# Patient Record
Sex: Male | Born: 1980 | Race: White | Hispanic: No | Marital: Married | Smoking: Former smoker
Health system: Southern US, Community
[De-identification: ages and names within clinical notes are randomized; demographics above are authoritative.]

## PROBLEM LIST (undated history)

## (undated) ENCOUNTER — Ambulatory Visit (HOSPITAL_COMMUNITY): Admission: EM | Payer: BC Managed Care – PPO | Source: Home / Self Care

## (undated) DIAGNOSIS — R519 Headache, unspecified: Secondary | ICD-10-CM

## (undated) DIAGNOSIS — R51 Headache: Secondary | ICD-10-CM

## (undated) DIAGNOSIS — K589 Irritable bowel syndrome without diarrhea: Secondary | ICD-10-CM

## (undated) DIAGNOSIS — M549 Dorsalgia, unspecified: Secondary | ICD-10-CM

## (undated) HISTORY — PX: SHOULDER SURGERY: SHX246

---
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
## 1998-10-01 ENCOUNTER — Ambulatory Visit (HOSPITAL_BASED_OUTPATIENT_CLINIC_OR_DEPARTMENT_OTHER): Admission: RE | Admit: 1998-10-01 | Discharge: 1998-10-01 | Payer: Self-pay | Admitting: Orthopedic Surgery

## 1999-08-22 ENCOUNTER — Encounter: Payer: Self-pay | Admitting: Emergency Medicine

## 1999-08-22 ENCOUNTER — Emergency Department (HOSPITAL_COMMUNITY): Admission: EM | Admit: 1999-08-22 | Discharge: 1999-08-22 | Payer: Self-pay | Admitting: Emergency Medicine

## 2004-03-30 ENCOUNTER — Emergency Department (HOSPITAL_COMMUNITY): Admission: EM | Admit: 2004-03-30 | Discharge: 2004-03-31 | Payer: Self-pay | Admitting: *Deleted

## 2007-11-27 ENCOUNTER — Emergency Department (HOSPITAL_COMMUNITY): Admission: EM | Admit: 2007-11-27 | Discharge: 2007-11-27 | Payer: Self-pay | Admitting: Emergency Medicine

## 2008-03-13 ENCOUNTER — Emergency Department (HOSPITAL_COMMUNITY): Admission: EM | Admit: 2008-03-13 | Discharge: 2008-03-13 | Payer: Self-pay | Admitting: Emergency Medicine

## 2008-05-26 ENCOUNTER — Emergency Department (HOSPITAL_COMMUNITY): Admission: EM | Admit: 2008-05-26 | Discharge: 2008-05-26 | Payer: Self-pay | Admitting: Emergency Medicine

## 2008-08-26 ENCOUNTER — Emergency Department (HOSPITAL_COMMUNITY): Admission: EM | Admit: 2008-08-26 | Discharge: 2008-08-26 | Payer: Self-pay | Admitting: Emergency Medicine

## 2008-10-30 ENCOUNTER — Emergency Department (HOSPITAL_COMMUNITY): Admission: EM | Admit: 2008-10-30 | Discharge: 2008-10-31 | Payer: Self-pay | Admitting: Emergency Medicine

## 2008-11-11 ENCOUNTER — Emergency Department (HOSPITAL_COMMUNITY): Admission: EM | Admit: 2008-11-11 | Discharge: 2008-11-11 | Payer: Self-pay | Admitting: Emergency Medicine

## 2008-11-14 ENCOUNTER — Encounter: Admission: RE | Admit: 2008-11-14 | Discharge: 2008-11-14 | Payer: Self-pay | Admitting: Neurology

## 2009-04-22 ENCOUNTER — Emergency Department (HOSPITAL_COMMUNITY): Admission: EM | Admit: 2009-04-22 | Discharge: 2009-04-22 | Payer: Self-pay | Admitting: Emergency Medicine

## 2010-08-30 LAB — CBC
HCT: 46.9 % (ref 39.0–52.0)
Hemoglobin: 15.9 g/dL (ref 13.0–17.0)
RBC: 5.13 MIL/uL (ref 4.22–5.81)
RDW: 12.9 % (ref 11.5–15.5)

## 2010-08-30 LAB — DIFFERENTIAL
Basophils Absolute: 0.1 10*3/uL (ref 0.0–0.1)
Eosinophils Relative: 3 % (ref 0–5)
Lymphocytes Relative: 35 % (ref 12–46)
Monocytes Absolute: 0.6 10*3/uL (ref 0.1–1.0)
Monocytes Relative: 6 % (ref 3–12)
Neutro Abs: 5.5 10*3/uL (ref 1.7–7.7)

## 2010-08-30 LAB — POCT I-STAT, CHEM 8
BUN: 16 mg/dL (ref 6–23)
Calcium, Ion: 1.12 mmol/L (ref 1.12–1.32)
Chloride: 105 mEq/L (ref 96–112)
Creatinine, Ser: 0.9 mg/dL (ref 0.4–1.5)
Glucose, Bld: 90 mg/dL (ref 70–99)
TCO2: 25 mmol/L (ref 0–100)

## 2010-09-01 LAB — DIFFERENTIAL
Basophils Relative: 0 % (ref 0–1)
Eosinophils Absolute: 0.2 10*3/uL (ref 0.0–0.7)
Lymphs Abs: 2.5 10*3/uL (ref 0.7–4.0)
Neutro Abs: 4.8 10*3/uL (ref 1.7–7.7)
Neutrophils Relative %: 59 % (ref 43–77)

## 2010-09-01 LAB — COMPREHENSIVE METABOLIC PANEL
ALT: 18 U/L (ref 0–53)
Alkaline Phosphatase: 40 U/L (ref 39–117)
BUN: 12 mg/dL (ref 6–23)
CO2: 28 mEq/L (ref 19–32)
Calcium: 9 mg/dL (ref 8.4–10.5)
GFR calc non Af Amer: >60 mL/min (ref >60)
Glucose, Bld: 98 mg/dL (ref 70–99)
Sodium: 139 mEq/L (ref 135–145)
Total Protein: 6.6 g/dL (ref 6.0–8.3)

## 2010-09-01 LAB — URINALYSIS, ROUTINE W REFLEX MICROSCOPIC
Bilirubin Urine: NEGATIVE
Glucose, UA: NEGATIVE mg/dL
Hgb urine dipstick: NEGATIVE
Ketones, ur: NEGATIVE mg/dL
Leukocytes, UA: NEGATIVE
pH: 5.5 (ref 5.0–8.0)

## 2010-09-01 LAB — CBC
HCT: 43.1 % (ref 39.0–52.0)
Hemoglobin: 14.6 g/dL (ref 13.0–17.0)
MCHC: 34 g/dL (ref 30.0–36.0)
MCV: 91.5 fL (ref 78.0–100.0)
RBC: 4.71 MIL/uL (ref 4.22–5.81)
RDW: 13.4 % (ref 11.5–15.5)

## 2010-09-01 LAB — LIPASE, BLOOD: Lipase: 20 U/L (ref 11–59)

## 2010-11-10 ENCOUNTER — Emergency Department: Payer: Self-pay | Admitting: Emergency Medicine

## 2011-02-01 ENCOUNTER — Emergency Department: Payer: Self-pay | Admitting: Emergency Medicine

## 2011-02-21 LAB — COMPREHENSIVE METABOLIC PANEL
ALT: 16
AST: 21
Albumin: 5
Alkaline Phosphatase: 46
CO2: 22
Chloride: 105
GFR calc Af Amer: >60
Potassium: 4
Total Bilirubin: 1.2

## 2011-02-21 LAB — CBC
Platelets: 204
RBC: 5.58
WBC: 11.9 — ABNORMAL HIGH

## 2011-02-21 LAB — DIFFERENTIAL
Basophils Absolute: 0
Basophils Relative: 0
Eosinophils Absolute: 0
Eosinophils Relative: 0
Monocytes Absolute: 0.3

## 2011-05-24 ENCOUNTER — Emergency Department: Payer: Self-pay | Admitting: Emergency Medicine

## 2011-05-24 LAB — COMPREHENSIVE METABOLIC PANEL
Albumin: 4.7 g/dL (ref 3.4–5.0)
Anion Gap: 12 (ref 7–16)
BUN: 23 mg/dL — ABNORMAL HIGH (ref 7–18)
Bilirubin,Total: 1 mg/dL (ref 0.2–1.0)
Co2: 22 mmol/L (ref 21–32)
Creatinine: 0.88 mg/dL (ref 0.60–1.30)
EGFR (African American): >60
Glucose: 124 mg/dL — ABNORMAL HIGH (ref 65–99)
Potassium: 4.1 mmol/L (ref 3.5–5.1)
SGOT(AST): 25 U/L (ref 15–37)
Sodium: 140 mmol/L (ref 136–145)
Total Protein: 7.8 g/dL (ref 6.4–8.2)

## 2011-05-24 LAB — URINALYSIS, COMPLETE
Blood: NEGATIVE
Glucose,UR: NEGATIVE mg/dL (ref 0–75)
Leukocyte Esterase: NEGATIVE
Nitrite: NEGATIVE
Ph: 5 (ref 4.5–8.0)
RBC,UR: 2 /HPF (ref 0–5)
Specific Gravity: 1.058 (ref 1.003–1.030)
Squamous Epithelial: NONE SEEN
WBC UR: 1 /HPF (ref 0–5)

## 2011-05-24 LAB — LIPASE, BLOOD: Lipase: 146 U/L (ref 73–393)

## 2011-05-24 LAB — RAPID INFLUENZA A&B ANTIGENS

## 2011-05-24 LAB — CBC
HGB: 16.2 g/dL (ref 13.0–18.0)
MCH: 31.2 pg (ref 26.0–34.0)
MCV: 92 fL (ref 80–100)
Platelet: 192 10*3/uL (ref 150–440)
RBC: 5.2 10*6/uL (ref 4.40–5.90)
RDW: 12.8 % (ref 11.5–14.5)

## 2011-06-01 ENCOUNTER — Ambulatory Visit: Payer: Self-pay | Admitting: Surgery

## 2011-06-06 ENCOUNTER — Ambulatory Visit: Payer: Self-pay | Admitting: Surgery

## 2011-12-05 ENCOUNTER — Emergency Department: Payer: Self-pay | Admitting: Emergency Medicine

## 2012-03-06 ENCOUNTER — Emergency Department: Payer: Self-pay | Admitting: Emergency Medicine

## 2012-03-06 LAB — URINALYSIS, COMPLETE
Blood: NEGATIVE
Ketone: NEGATIVE
Ph: 5 (ref 4.5–8.0)
Protein: NEGATIVE
Specific Gravity: 1.03 (ref 1.003–1.030)
Squamous Epithelial: NONE SEEN

## 2012-03-19 ENCOUNTER — Ambulatory Visit: Payer: Self-pay | Admitting: Surgery

## 2012-04-17 ENCOUNTER — Emergency Department: Payer: Self-pay | Admitting: Emergency Medicine

## 2012-04-17 LAB — COMPREHENSIVE METABOLIC PANEL
Albumin: 4.1 g/dL (ref 3.4–5.0)
Alkaline Phosphatase: 45 U/L — ABNORMAL LOW (ref 50–136)
Anion Gap: 6 — ABNORMAL LOW (ref 7–16)
BUN: 12 mg/dL (ref 7–18)
Calcium, Total: 8.9 mg/dL (ref 8.5–10.1)
Co2: 27 mmol/L (ref 21–32)
EGFR (African American): >60
EGFR (Non-African Amer.): >60
Glucose: 95 mg/dL (ref 65–99)
Potassium: 4.1 mmol/L (ref 3.5–5.1)
SGOT(AST): 36 U/L (ref 15–37)
SGPT (ALT): 26 U/L (ref 12–78)

## 2012-04-17 LAB — URINALYSIS, COMPLETE
Bacteria: NONE SEEN
Bilirubin,UR: NEGATIVE
Leukocyte Esterase: NEGATIVE
Ph: 5 (ref 4.5–8.0)
Squamous Epithelial: NONE SEEN

## 2012-04-17 LAB — CBC
MCH: 31.2 pg (ref 26.0–34.0)
MCV: 91 fL (ref 80–100)
Platelet: 178 10*3/uL (ref 150–440)
RBC: 4.51 10*6/uL (ref 4.40–5.90)
RDW: 12.8 % (ref 11.5–14.5)
WBC: 7.3 10*3/uL (ref 3.8–10.6)

## 2012-04-17 LAB — LIPASE, BLOOD: Lipase: 81 U/L (ref 73–393)

## 2012-11-24 ENCOUNTER — Emergency Department: Payer: Self-pay | Admitting: Emergency Medicine

## 2013-03-15 ENCOUNTER — Emergency Department (HOSPITAL_COMMUNITY): Payer: Worker's Compensation

## 2013-03-15 ENCOUNTER — Emergency Department (HOSPITAL_COMMUNITY)
Admission: EM | Admit: 2013-03-15 | Discharge: 2013-03-15 | Disposition: A | Discharge status: Home / Self Care | Payer: Worker's Compensation | Attending: Emergency Medicine | Admitting: Emergency Medicine

## 2013-03-15 DIAGNOSIS — S60219A Contusion of unspecified wrist, initial encounter: Secondary | ICD-10-CM | POA: Insufficient documentation

## 2013-03-15 DIAGNOSIS — Y9289 Other specified places as the place of occurrence of the external cause: Secondary | ICD-10-CM | POA: Insufficient documentation

## 2013-03-15 DIAGNOSIS — IMO0002 Reserved for concepts with insufficient information to code with codable children: Secondary | ICD-10-CM | POA: Insufficient documentation

## 2013-03-15 DIAGNOSIS — S60212A Contusion of left wrist, initial encounter: Secondary | ICD-10-CM

## 2013-03-15 DIAGNOSIS — Y9389 Activity, other specified: Secondary | ICD-10-CM | POA: Insufficient documentation

## 2013-03-15 DIAGNOSIS — Y990 Civilian activity done for income or pay: Secondary | ICD-10-CM | POA: Insufficient documentation

## 2013-03-15 MED ORDER — IBUPROFEN 600 MG PO TABS: 600.0000 mg | ORAL_TABLET | Freq: Once | ORAL | Status: DC

## 2013-03-15 MED ORDER — IBUPROFEN 200 MG PO TABS
600.0000 mg | ORAL_TABLET | Freq: Once | ORAL | Status: AC
  Administered 2013-03-15: 600 mg via ORAL
  Filled 2013-03-15: qty 3

## 2013-03-15 NOTE — Progress Notes (Signed)
Patient confirms his pcp is at [facility].  Patient has the orange card.  EDCM provided patient patient with a list of financial assistance in the community, crisis intervention information, and dental assistance for patients without insurance.  Patient thankful for information.  No further needs at this time.

## 2013-03-15 NOTE — ED Notes (Signed)
Pt states he hit his L forearm on the top of a cooler at work. Pt c/o pain to L wrist pain. Pt states EMS examined his L wrist on scene. Pt arrives with SAM splint to L wrist. ROM intact, but painful.
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
## 2013-03-22 NOTE — ED Provider Notes (Signed)
Medical screening examination/treatment/procedure(s) were performed by non-physician practitioner and as supervising physician I was immediately available for consultation/collaboration.   Leota Jacobsen, MD 03/22/13 [phone]

## 2013-07-31 ENCOUNTER — Encounter (HOSPITAL_COMMUNITY): Payer: Self-pay | Admitting: Emergency Medicine

## 2013-07-31 ENCOUNTER — Emergency Department (HOSPITAL_COMMUNITY)
Admission: EM | Admit: 2013-07-31 | Discharge: 2013-07-31 | Disposition: A | Discharge status: Home / Self Care | Payer: Worker's Compensation | Attending: Emergency Medicine | Admitting: Emergency Medicine

## 2013-07-31 DIAGNOSIS — S0590XA Unspecified injury of unspecified eye and orbit, initial encounter: Secondary | ICD-10-CM

## 2013-07-31 DIAGNOSIS — Y929 Unspecified place or not applicable: Secondary | ICD-10-CM | POA: Insufficient documentation

## 2013-07-31 DIAGNOSIS — Y9389 Activity, other specified: Secondary | ICD-10-CM | POA: Insufficient documentation

## 2013-07-31 DIAGNOSIS — X131XXA Other contact with steam and other hot vapors, initial encounter: Secondary | ICD-10-CM

## 2013-07-31 DIAGNOSIS — Z8719 Personal history of other diseases of the digestive system: Secondary | ICD-10-CM | POA: Insufficient documentation

## 2013-07-31 DIAGNOSIS — X12XXXA Contact with other hot fluids, initial encounter: Secondary | ICD-10-CM | POA: Insufficient documentation

## 2013-07-31 DIAGNOSIS — T2000XA Burn of unspecified degree of head, face, and neck, unspecified site, initial encounter: Secondary | ICD-10-CM

## 2013-07-31 DIAGNOSIS — T2640XA Burn of unspecified eye and adnexa, part unspecified, initial encounter: Secondary | ICD-10-CM | POA: Insufficient documentation

## 2013-07-31 DIAGNOSIS — S058X9A Other injuries of unspecified eye and orbit, initial encounter: Secondary | ICD-10-CM | POA: Insufficient documentation

## 2013-07-31 HISTORY — DX: Irritable bowel syndrome, unspecified: K58.9

## 2013-07-31 MED ORDER — FLUORESCEIN SODIUM 1 MG OP STRP
1.0000 | ORAL_STRIP | Freq: Once | OPHTHALMIC | Status: AC
  Administered 2013-07-31: 1 via OPHTHALMIC
  Filled 2013-07-31: qty 1

## 2013-07-31 MED ORDER — ERYTHROMYCIN 5 MG/GM OP OINT
TOPICAL_OINTMENT | Freq: Once | OPHTHALMIC | Status: AC
  Administered 2013-07-31: 21:00:00 via OPHTHALMIC

## 2013-07-31 MED ORDER — BACITRACIN-NEOMYCIN-POLYMYXIN OINTMENT TUBE
TOPICAL_OINTMENT | Freq: Once | CUTANEOUS | Status: AC
  Administered 2013-07-31: 21:00:00 via TOPICAL
  Filled 2013-07-31: qty 15

## 2013-07-31 MED ORDER — TETRACAINE HCL 0.5 % OP SOLN
1.0000 [drp] | Freq: Once | OPHTHALMIC | Status: AC
  Administered 2013-07-31: 1 [drp] via OPHTHALMIC
  Filled 2013-07-31: qty 2
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
## 2013-07-31 NOTE — ED Provider Notes (Signed)
Medical screening examination/treatment/procedure(s) were performed by non-physician practitioner and as supervising physician I was immediately available for consultation/collaboration.   EKG Interpretation None        Blanchie Dessert, MD 07/31/13 2330
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
## 2014-07-20 ENCOUNTER — Emergency Department (HOSPITAL_COMMUNITY)
Admission: EM | Admit: 2014-07-20 | Discharge: 2014-07-20 | Disposition: A | Discharge status: Home / Self Care | Payer: Medicaid Other | Attending: Emergency Medicine | Admitting: Emergency Medicine

## 2014-07-20 ENCOUNTER — Encounter (HOSPITAL_COMMUNITY): Payer: Self-pay

## 2014-07-20 DIAGNOSIS — K589 Irritable bowel syndrome without diarrhea: Secondary | ICD-10-CM | POA: Insufficient documentation

## 2014-07-20 DIAGNOSIS — R519 Headache, unspecified: Secondary | ICD-10-CM

## 2014-07-20 DIAGNOSIS — R51 Headache: Secondary | ICD-10-CM | POA: Insufficient documentation

## 2014-07-20 HISTORY — DX: Headache, unspecified: R51.9

## 2014-07-20 HISTORY — DX: Headache: R51

## 2014-07-20 MED ORDER — KETOROLAC TROMETHAMINE 30 MG/ML IJ SOLN
30.0000 mg | Freq: Once | INTRAMUSCULAR | Status: AC
  Administered 2014-07-20: 30 mg via INTRAVENOUS
  Filled 2014-07-20: qty 1

## 2014-07-20 MED ORDER — DIPHENHYDRAMINE HCL 50 MG/ML IJ SOLN
50.0000 mg | Freq: Once | INTRAMUSCULAR | Status: AC
  Administered 2014-07-20: 50 mg via INTRAVENOUS
  Filled 2014-07-20: qty 1

## 2014-07-20 MED ORDER — TRAMADOL HCL 50 MG PO TABS: ORAL_TABLET | ORAL | Status: DC

## 2014-07-20 MED ORDER — METOCLOPRAMIDE HCL 5 MG/ML IJ SOLN
10.0000 mg | Freq: Once | INTRAMUSCULAR | Status: AC
  Administered 2014-07-20: 10 mg via INTRAVENOUS
  Filled 2014-07-20: qty 2

## 2014-07-20 NOTE — ED Provider Notes (Signed)
CSN: [id number]     Arrival date & time 07/20/14  1012 History  This chart was scribed for Brian Diego, MD by Stephania Fragmin, ED Scribe. This patient was seen in room APA08/APA08 and the patient's care was started at 10:37 AM.    Chief Complaint  Patient presents with  . Migraine   Patient is a 34 y.o. male presenting with migraines. The history is provided by the patient. No language interpreter was used.  Migraine This is a recurrent problem. The current episode started 2 days ago. The problem occurs constantly. The problem has been gradually worsening. Associated symptoms include headaches. Pertinent negatives include no chest pain and no abdominal pain. Nothing aggravates the symptoms. Nothing relieves the symptoms. He has tried acetaminophen (Excedrin Migraine) for the symptoms. The treatment provided no relief.     HPI Comments: Brian Hess is a 34 y.o. male who presents to the Emergency Department complaining of a severe, constant headache, right greater than left, that started 2 days ago at night and has been worsening since. Per nurse, patient is unable to open his right eye. He has a history of migraines that have been ongoing for several years but states they have never been this severe. Patient usually treats his headaches with Excedrin Migraine, which complete relieve them, but it hasn't worked for this headache.  Past Medical History  Diagnosis Date  . IBS (irritable bowel syndrome)   . Headache    Past Surgical History  Procedure Laterality Date  . Shoulder surgery     No family history on file. History  Substance Use Topics  . Smoking status: Never Smoker   . Smokeless tobacco: Not on file  . Alcohol Use: No    Review of Systems  Constitutional: Negative for appetite change and fatigue.  HENT: Negative for congestion, ear discharge and sinus pressure.   Eyes: Positive for photophobia. Negative for discharge.  Respiratory: Negative for cough.   Cardiovascular:  Negative for chest pain.  Gastrointestinal: Negative for abdominal pain and diarrhea.  Genitourinary: Negative for frequency and hematuria.  Musculoskeletal: Negative for back pain.  Skin: Negative for rash.  Neurological: Positive for headaches. Negative for seizures.  Psychiatric/Behavioral: Negative for hallucinations.      Allergies  Review of patient's allergies indicates no known allergies.  Home Medications   Prior to Admission medications   Medication Sig Start Date End Date Taking? Authorizing Provider  dicyclomine (BENTYL) 10 MG capsule Take 10 mg by mouth daily as needed for spasms.    Historical Provider, MD  ibuprofen (ADVIL,MOTRIN) 600 MG tablet Take 1 tablet (600 mg total) by mouth once. 03/15/13   Garald Balding, NP   BP 119/77 mmHg  Pulse 83  Temp(Src) 98 F (36.7 C) (Oral)  Resp 16  Ht 6' (1.829 m)  Wt 128 lb (58.06 kg)  BMI 17.36 kg/m2  SpO2 97% Physical Exam  Constitutional: He is oriented to person, place, and time. He appears well-developed.  HENT:  Head: Normocephalic.  Eyes: Conjunctivae and EOM are normal. No scleral icterus.  Neck: Neck supple. No thyromegaly present.  Cardiovascular: Normal rate and regular rhythm.  Exam reveals no gallop and no friction rub.   No murmur heard. Pulmonary/Chest: No stridor. He has no wheezes. He has no rales. He exhibits no tenderness.  Abdominal: He exhibits no distension. There is no tenderness. There is no rebound.  Musculoskeletal: Normal range of motion. He exhibits no edema.  Lymphadenopathy:    He has  no cervical adenopathy.  Neurological: He is oriented to person, place, and time. He exhibits normal muscle tone. Coordination normal.  Skin: No rash noted. No erythema.  Psychiatric: He has a normal mood and affect. His behavior is normal.  Nursing note and vitals reviewed.   ED Course  Procedures (including critical care time)  DIAGNOSTIC STUDIES: Oxygen Saturation is 97% on room air, normal by my  interpretation.    COORDINATION OF CARE: 10:40 AM - Discussed treatment plan with pt at bedside which includes medication, and pt agreed to plan.   Medications  ketorolac (TORADOL) 30 MG/ML injection 30 mg (not administered)  metoCLOPramide (REGLAN) injection 10 mg (not administered)  diphenhydrAMINE (BENADRYL) injection 50 mg (not administered)    Labs Review Labs Reviewed - No data to display  Imaging Review No results found.   EKG Interpretation None      MDM   Final diagnoses:  None    Headache improve,   rx ultram  The chart was scribed for me under my direct supervision.  I personally performed the history, physical, and medical decision making and all procedures in the evaluation of this patient.Brian Diego, MD 07/20/14 [phone]

## 2014-07-20 NOTE — Discharge Instructions (Signed)
Follow up as needed with your md °

## 2014-07-20 NOTE — ED Notes (Signed)
Pt states he has had a migraine for two days. States he woke up this morning and was unable to see out of his right eye and his blood pressure was elevated. States he took his Imitrex but it did not help

## 2015-01-09 ENCOUNTER — Encounter (HOSPITAL_COMMUNITY): Payer: Self-pay | Admitting: *Deleted

## 2015-01-09 ENCOUNTER — Emergency Department (HOSPITAL_COMMUNITY): Payer: Medicaid Other

## 2015-01-09 ENCOUNTER — Emergency Department (HOSPITAL_COMMUNITY)
Admission: EM | Admit: 2015-01-09 | Discharge: 2015-01-09 | Disposition: A | Discharge status: Home / Self Care | Payer: Medicaid Other | Attending: Emergency Medicine | Admitting: Emergency Medicine

## 2015-01-09 DIAGNOSIS — X58XXXA Exposure to other specified factors, initial encounter: Secondary | ICD-10-CM | POA: Insufficient documentation

## 2015-01-09 DIAGNOSIS — S66912A Strain of unspecified muscle, fascia and tendon at wrist and hand level, left hand, initial encounter: Secondary | ICD-10-CM | POA: Insufficient documentation

## 2015-01-09 DIAGNOSIS — Y9289 Other specified places as the place of occurrence of the external cause: Secondary | ICD-10-CM | POA: Diagnosis not present

## 2015-01-09 DIAGNOSIS — Y998 Other external cause status: Secondary | ICD-10-CM | POA: Insufficient documentation

## 2015-01-09 DIAGNOSIS — Y9389 Activity, other specified: Secondary | ICD-10-CM | POA: Diagnosis not present

## 2015-01-09 DIAGNOSIS — S6992XA Unspecified injury of left wrist, hand and finger(s), initial encounter: Secondary | ICD-10-CM | POA: Diagnosis present

## 2015-01-09 DIAGNOSIS — Z7982 Long term (current) use of aspirin: Secondary | ICD-10-CM | POA: Diagnosis not present

## 2015-01-09 DIAGNOSIS — Z8719 Personal history of other diseases of the digestive system: Secondary | ICD-10-CM | POA: Insufficient documentation

## 2015-01-09 NOTE — ED Provider Notes (Signed)
CSN: [id number]     Arrival date & time 01/09/15  0941 History   First MD Initiated Contact with Patient 01/09/15 [phone]     Chief Complaint  Patient presents with  . Wrist Pain     HPI   Brian Hess is a 34 y.o. male with a PMH of IBS who presents to the ED with left wrist pain. 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 He reports constant, burning pain to the ulnar aspect of his left wrist. He denies numbness or paresthesia. He states moving his wrist exacerbates his pain, and has tried ice for symptom relief. He reports holding his left wrist still helps to alleviate his pain.     Past Medical History  Diagnosis Date  . IBS (irritable bowel syndrome)   . Headache    Past Surgical History  Procedure Laterality Date  . Shoulder surgery     No family history on file. Social History  Substance Use Topics  . Smoking status: Never Smoker   . Smokeless tobacco: None  . Alcohol Use: No     Review of Systems  Constitutional: Negative for fever and chills.  Respiratory: Negative for shortness of breath.   Cardiovascular: Negative for chest pain.  Gastrointestinal: Negative for nausea, vomiting and abdominal pain.  Musculoskeletal: Positive for arthralgias.  Skin: Negative for color change, pallor, rash and wound.  Neurological: Positive for weakness. Negative for dizziness, light-headedness, numbness and headaches.       Reports his left hand seems weaker, which he attributes to pain.    Allergies  Review of patient's allergies indicates no known allergies.  Home Medications   Prior to Admission medications   Medication Sig Start Date End Date Taking? Authorizing Provider  aspirin-acetaminophen-caffeine (EXCEDRIN MIGRAINE) [phone] MG per tablet Take 1 tablet by mouth  every 6 (six) hours as needed for migraine.    Historical Provider, MD  dicyclomine (BENTYL) 10 MG capsule Take 10 mg by mouth daily as needed for spasms.    Historical Provider, MD  HYDROcodone-acetaminophen (NORCO/VICODIN) 5-325 MG per tablet Take 1 tablet by mouth 3 (three) times daily as needed. 07/08/14   Historical Provider, MD  ibuprofen (ADVIL,MOTRIN) 600 MG tablet Take 1 tablet (600 mg total) by mouth once. Patient not taking: Reported on 07/20/2014 03/15/13   Junius Creamer, NP  traMADol Veatrice Bourbon) 50 MG tablet Take one every 8-12 hours if needed for a headache not helped by your usual medicine 07/20/14   Milton Ferguson, MD    BP 120/75 mmHg  Pulse 84  Temp(Src) 98 F (36.7 C) (Oral)  Resp 14  SpO2 99% Physical Exam  Constitutional: He is oriented to person, place, and time. He appears well-developed and well-nourished. No distress.  HENT:  Head: Normocephalic and atraumatic.  Right Ear: External ear normal.  Left Ear: External ear normal.  Nose: Nose normal.  Mouth/Throat: Oropharynx is clear and moist.  Eyes: Conjunctivae and EOM are normal. Pupils are equal, round, and reactive to light.  Neck: Normal range of motion.  Cardiovascular: Normal rate, regular rhythm and intact distal pulses.   Pulmonary/Chest: Effort normal and breath sounds normal.  Musculoskeletal: He exhibits tenderness. He exhibits no edema.       Left wrist: He exhibits tenderness and  bony tenderness. He exhibits normal range of motion, no swelling, no effusion, no crepitus, no deformity and no laceration.  Tenderness to palpation over ulnar aspect of left wrist. Decreased range of motion of left wrist due to pain.  Neurological: He is alert and oriented to person, place, and time. No sensory deficit.  Decreased grip strength of left hand due to pain.   Skin: Skin is warm and dry. No rash noted. He is not diaphoretic. No erythema. No pallor.  Psychiatric: He has a normal mood and affect. His behavior is normal.  Judgment and thought content normal.  Nursing note and vitals reviewed.   ED Course  Procedures (including critical care time)  Labs Review Labs Reviewed - No data to display  Imaging Review Dg Wrist Complete Left  01/09/2015   CLINICAL DATA:  Left wrist pain and swelling  EXAM: LEFT WRIST - COMPLETE 3+ VIEW  COMPARISON:  03/15/2013  FINDINGS: There is no evidence of fracture or dislocation. There is no evidence of arthropathy or other focal bone abnormality. Soft tissues are unremarkable.  IMPRESSION: Negative.   Electronically Signed   By: Lahoma Crocker M.D.   On: 01/09/2015 11:00    I have personally reviewed and evaluated these images and lab results as part of my medical decision-making.   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 Denies numbness, paresthesia. Ulnar aspect of right wrist tender to palpation on exam. Decreased range of motion of left wrist due to pain. Decreased grip strength of left wrist due to pain. Sensation intact. Distal pulses intact.   Imaging of left wrist obtained given bony tenderness; x-ray negative for fracture. Symptoms likely due to left wrist strain. Advised patient to take ibuprofen, which he states he has at home, and use ice. Patient given wrist splint for symptom relief. Return precautions discussed. Patient to follow up with PCP.  BP 116/78 mmHg  Pulse 76  Temp(Src) 98.2 F (36.8 C) (Oral)  Resp 16  Ht 5' 10"  (1.778 m)  Wt 140 lb (63.504 kg)  BMI 20.09 kg/m2  SpO2 100%     Marella Chimes, PA-C 01/09/15 [place], MD 01/10/15 [phone]

## 2015-01-09 NOTE — Discharge Instructions (Signed)
1. Medications: ibuprofen, usual home medications 2. Treatment: rest, drink plenty of fluids, ice 3. Follow Up: please followup with your primary doctor this week for discussion of your diagnoses and further evaluation after today's visit; if you do not have a primary care doctor use the resource guide provided to find one; please return to the ER for severe pain, fever, chills, weakness, new or worsening symptoms   Emergency Department Resource Guide 1) Find a Doctor and Pay Out of Pocket Although you won't have to find out who is covered by your insurance plan, it is a good idea to ask around and get recommendations. You will then need to call the office and see if the doctor you have chosen will accept you as a new patient and what types of options they offer for patients who are self-pay. Some doctors offer discounts or will set up payment plans for their patients who do not have insurance, but you will need to ask so you aren't surprised when you get to your appointment.  2) Contact Your Local Health Department Not all health departments have doctors that can see patients for sick visits, but many do, so it is worth a call to see if yours does. If you don't know where your local health department is, you can check in your phone book. The CDC also has a tool to help you locate your state's health department, and many state websites also have listings of all of their local health departments.  3) Find a [facility] If your illness is not likely to be very severe or complicated, you may want to try a walk in clinic. These are popping up all over the country in pharmacies, drugstores, and shopping centers. They're usually staffed by nurse practitioners or physician assistants that have been trained to treat common illnesses and complaints. They're usually fairly quick and inexpensive. However, if you have serious medical issues or chronic medical problems, these are probably not your best  option.  No Primary Care Doctor: - Call Health Connect at  [phone] - they can help you locate a primary care doctor that  accepts your insurance, provides certain services, etc. - Physician Referral Service- [phone]  Chronic Pain Problems: Organization         Address  Phone   Notes  [facility]  [phone] Patients need to be referred by their primary care doctor.   Medication Assistance: Organization         Address  Phone   Notes  [facility] Medication [facility] [address] [place], [place] [phone] --Must be a resident of [facility] -- Must have NO insurance coverage whatsoever (no Medicaid/ Medicare, etc.) -- The pt. MUST have a primary care doctor that directs their care regularly and follows them in the community   [facility]ist  [phone]   [employer]  [phone]    Agencies that provide inexpensive medical care: Organization         Address  Phone   Notes  [place]  [phone]   [facility]    [phone]   [facility] [place], [place] [address] [phone]   [place] [phone]   Planned Parenthood    [phone]   [facility]    [phone]   [facility]  [address], [place] Phone:  [phone], Fax:  [phone] Hours of Operation:  9 am - 6 pm, M-F.  Also accepts Medicaid/Medicare and self-pay.  [facility] for [place], [address], [place] Phone: [phone], Fax: [phone]. Hours of Operation:  8:30 am - 5:30 pm, M-F.  Also accepts Medicaid and self-pay.  [facility] [place] [address], [place] Phone: [phone]   [place], [place], [place] [phone], Ext. [phone] Mondays & Thursdays: 7-9 AM.  First 15  patients are seen on a first come, first serve basis.    [place] Providers:  Organization         Address  Phone   Notes  [facility] [address], [place] [phone] Also accepts self-pay patients.  [facility] [address], [place]  [phone]   [place], [address], [place] [phone]   [facility] [address] [place] [phone]   Lucianne Lei [address], [place]   [phone] Only accepts [place] Access [place] patients after they have their name applied to their card.   Self-Pay (no insurance) in [facility]:  Organization         Address  Phone   Notes  Sickle Cell Patients, [facility] [place] [phone]   [facility] [place] [phone]   [facility] [place]  [place], [place], [place] [phone]   [facility]/Dr. Osei-Bonsu  [address], [place] [address], [place] [phone] Phone number for both [place] and [place] locations is the same.  [facility]gent [facility] [address] [place] [phone]   [facility] [address], [place] or [address] [phone]   [facility] [address] [place] [phone], phone; [phone], fax Sees patients 1st and 3rd Saturday of every month.  Must not qualify for public or private insurance (i.e. Medicaid, Medicare, [place] Health Choice, Veterans' Benefits)  Household income should be no more than 200% of the poverty level The clinic cannot treat you if you are pregnant or think you are pregnant  Sexually transmitted diseases are not treated at the clinic.    Dental  Care: Organization         Address  Phone  Notes  [facility] Department of [facility] [place] [phone] Accepts children up to age 61 who are enrolled in [place] or [employer] [place]; pregnant women with a Medicaid card; and children who have applied for Medicaid or [place] Health Choice, but were declined, whose parents can pay a reduced fee at time of service.  [facility] Department of [facility]  [address], [place] [phone] Accepts children up to age 47 who are enrolled in [place] or [place]; pregnant women with a Medicaid card; and children who have applied for Medicaid or [place] Health Choice, but were declined, whose parents can pay a reduced fee at time of service.  [facility] Adult Dental Access PROGRAM  [place], [place] [phone]  Patients are seen by appointment only. Walk-ins are not accepted. [place] will see patients 1 years of age and older. Monday - Tuesday (8am-5pm) Most Wednesdays (8:30-5pm) $30 per visit, cash only  [facility] Adult Dental Access PROGRAM  [address], [facility] [phone] Patients are seen by appointment only. Walk-ins are not accepted. [place] will see patients 28 years of age and older. One Wednesday Evening (Monthly: Volunteer Based).  $30 per visit, cash only  [place]  [phone] for adults; Children under age 81, call Graduate Pediatric Dentistry at [phone]. Children aged 82-14, please call [phone] to request a pediatric application.  Dental services are provided in all areas of dental care including fillings, crowns and bridges, complete and partial dentures, implants, gum treatment, root canals, and extractions. Preventive care is also provided. Treatment is provided to both adults and children. Patients are selected via a lottery and there is often a waiting list.   [facility] [address] [place]  [phone] [email or web address]   [facility] [address] [place], [place] [phone], Ext. [phone] Second and Fourth Thursday of each month, opens at 6:30 AM; Clinic ends at 9 AM.  Patients are seen on a first-come first-served basis, and a limited number are seen during each clinic.   [facility]  [address] Hillard Danker [place], [place] [phone]   Eligibility Requirements You must have lived in [place], [place], or [place] counties for at least the last three months.   You cannot be eligible for state or federal sponsored [employer], including [employer], [place], or [employer].   You generally cannot be eligible for healthcare insurance through your employer.    How to apply: Eligibility screenings are held every Tuesday and Wednesday afternoon from 1:00 pm until 4:00 pm. You do not need an appointment for the interview!  [facility] [address] [place], [place]   [place]  [place] Department  [place]  [phone]    Behavioral Health Resources in the Community: Intensive Outpatient Programs Organization         Address  Phone  Notes  [place]. [address], [place], [place] [phone]   [facility] Outpatient [address] [place], [place]   ADS: Alcohol & Drug Svcs [address], [place], [place]   [place] [address],  [place], [place] or [phone]   Substance Abuse Resources Organization         Address  Phone  Notes  Alcohol and Drug Services  [phone]   [place]  [phone]   The [place]   Chinita Pester  [phone]   Residential & Outpatient Substance Abuse Program  [phone]    Psychological Services Organization         Address  Phone  Notes  [facility] [place]  [place]  [phone]   [place] [address], [place] or [phone]    Mobile Crisis Teams Organization         Address  Phone  Notes  [facility], Mobile Crisis Care Unit  [phone]   [facility]  [address] [employer], [place]   [facility] [address] [place]  [place] [phone]    Self-Help/Support Groups Organization         Address  Phone             Notes  [facility] [place] - variety of support groups  [place] Call for more information  Narcotics Anonymous (NA), [facility]aring [facility] [address], [employer] [place]  2 meetings at this location   Special educational needs teacher         Address  Phone  Notes  ASAP Residential Treatment [place],    [place]  [phone]   [facility]  [address] [place] [address], [place], [place]   [place], [place] [phone] Admissions: 8am-3pm M-F  Incentives Substance [place] [address]    [place], [place] [phone]   [facility] [address] [place], [place], [place]   [facility] [address],  [place], [place]   [facility] Programs - Intensive Outpatient [address] Kristeen [address], [place], [place]   [facility] ([place] [place].,  [place], [place] [phone] or [phone]   [facility] Services (RTS) [address], [place], [place] Accepts Medicaid  [facility] [place] [address],  [place] [phone] Substance Abuse/Addiction Treatment   [facility] Organization         Address  Phone  Notes  [facility]  [phone]   Domenic Schwab, PhD [address] Arlis [place], [place]   [phone] or [phone]   [place], [place] [phone]   [facility] [address], [place], [place] [phone] Insurance/Medicaid/sponsorship through [facility] and Families [address], [address] [place]                                    [place], [place] [phone] [place] [address], [place] [phone]    Dr. Adele Schilder  [phone]   [facility] Dept. 1) [address] [place] 2) [place] 3)  [place] [address], [place] [phone]  [phone]   [place] [phone] or [phone] (After Hours)        Sprain A sprain happens when the bands of tissue that connect bones and hold joints together (ligaments) stretch too much or tear. HOME CARE  Raise (elevate) the injured area to lessen puffiness (swelling).  Put ice on the injured area 2 times a day for 2-3 days.  Put ice in a plastic bag.  Place a towel between your skin and the bag.  Leave the ice on for 15 minutes.  Only take medicine as told by your doctor.  Protect your injured area until your pain and stiffness go away.  Do not get your cast or splint wet. Cover your cast or splint with a plastic bag when you shower or take a bath. Do not swim in a pool.  Your doctor may suggest exercises during your recovery to keep from getting stiff. GET HELP RIGHT AWAY IF:   Your cast or splint becomes damaged.  Your pain gets worse. MAKE SURE  YOU:   Understand these instructions.  Will watch this condition.  Will get help right away if you are not doing well or get worse. Document Released: 10/26/2007 Document Revised: 02/27/2013 Document Reviewed: 05/21/2011 [facility] Patient Information 2015  [place], [place]. This information is not intended to replace advice given to you by your health care provider. Make sure you discuss any questions you have with your health care provider.

## 2015-01-09 NOTE — ED Notes (Signed)
Pt reports Lt wrist pain since Tuesday. Pt reports he works and lifts heavy objects multiple times a day.

## 2015-07-17 ENCOUNTER — Emergency Department (HOSPITAL_COMMUNITY): Payer: Self-pay

## 2015-07-17 ENCOUNTER — Encounter (HOSPITAL_COMMUNITY): Payer: Self-pay | Admitting: Emergency Medicine

## 2015-07-17 ENCOUNTER — Emergency Department (HOSPITAL_COMMUNITY)
Admission: EM | Admit: 2015-07-17 | Discharge: 2015-07-17 | Disposition: A | Discharge status: Home / Self Care | Payer: Self-pay | Attending: Emergency Medicine | Admitting: Emergency Medicine

## 2015-07-17 DIAGNOSIS — N50819 Testicular pain, unspecified: Secondary | ICD-10-CM

## 2015-07-17 DIAGNOSIS — F17200 Nicotine dependence, unspecified, uncomplicated: Secondary | ICD-10-CM | POA: Insufficient documentation

## 2015-07-17 DIAGNOSIS — N50811 Right testicular pain: Secondary | ICD-10-CM | POA: Insufficient documentation

## 2015-07-17 DIAGNOSIS — R110 Nausea: Secondary | ICD-10-CM | POA: Insufficient documentation

## 2015-07-17 HISTORY — DX: Dorsalgia, unspecified: M54.9

## 2015-07-17 LAB — URINALYSIS, ROUTINE W REFLEX MICROSCOPIC
Bilirubin Urine: NEGATIVE
GLUCOSE, UA: NEGATIVE mg/dL
HGB URINE DIPSTICK: NEGATIVE
Ketones, ur: NEGATIVE mg/dL
Leukocytes, UA: NEGATIVE
Nitrite: NEGATIVE
Protein, ur: NEGATIVE mg/dL
SPECIFIC GRAVITY, URINE: 1.023 (ref 1.005–1.030)
pH: 6 (ref 5.0–8.0)

## 2015-07-17 MED ORDER — IBUPROFEN 400 MG PO TABS
800.0000 mg | ORAL_TABLET | Freq: Once | ORAL | Status: AC
  Administered 2015-07-17: 800 mg via ORAL
  Filled 2015-07-17: qty 4

## 2015-07-17 NOTE — Discharge Instructions (Signed)
You have been seen today for testicular pain. Your imaging showed no abnormalities. Follow up with PCP as needed should symptoms continue. Return to ED should symptoms worsen. Use ibuprofen or Tylenol for pain.
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
## 2015-09-17 ENCOUNTER — Encounter (HOSPITAL_COMMUNITY): Payer: Self-pay | Admitting: Emergency Medicine

## 2015-09-17 ENCOUNTER — Emergency Department (HOSPITAL_COMMUNITY)
Admission: EM | Admit: 2015-09-17 | Discharge: 2015-09-17 | Disposition: A | Discharge status: Home / Self Care | Payer: Self-pay | Attending: Emergency Medicine | Admitting: Emergency Medicine

## 2015-09-17 ENCOUNTER — Emergency Department (HOSPITAL_COMMUNITY): Payer: Self-pay

## 2015-09-17 DIAGNOSIS — Y998 Other external cause status: Secondary | ICD-10-CM | POA: Insufficient documentation

## 2015-09-17 DIAGNOSIS — W2209XA Striking against other stationary object, initial encounter: Secondary | ICD-10-CM | POA: Insufficient documentation

## 2015-09-17 DIAGNOSIS — S0991XA Unspecified injury of ear, initial encounter: Secondary | ICD-10-CM | POA: Insufficient documentation

## 2015-09-17 DIAGNOSIS — Z8719 Personal history of other diseases of the digestive system: Secondary | ICD-10-CM | POA: Insufficient documentation

## 2015-09-17 DIAGNOSIS — Y9289 Other specified places as the place of occurrence of the external cause: Secondary | ICD-10-CM | POA: Insufficient documentation

## 2015-09-17 DIAGNOSIS — R197 Diarrhea, unspecified: Secondary | ICD-10-CM | POA: Insufficient documentation

## 2015-09-17 DIAGNOSIS — S060X0A Concussion without loss of consciousness, initial encounter: Secondary | ICD-10-CM | POA: Insufficient documentation

## 2015-09-17 DIAGNOSIS — F17200 Nicotine dependence, unspecified, uncomplicated: Secondary | ICD-10-CM | POA: Insufficient documentation

## 2015-09-17 DIAGNOSIS — Y9389 Activity, other specified: Secondary | ICD-10-CM | POA: Insufficient documentation

## 2015-09-17 LAB — CBC WITH DIFFERENTIAL/PLATELET
BASOS ABS: 0 10*3/uL (ref 0.0–0.1)
Basophils Relative: 0 %
EOS PCT: 3 %
Eosinophils Absolute: 0.1 10*3/uL (ref 0.0–0.7)
HCT: 43.2 % (ref 39.0–52.0)
Hemoglobin: 14.6 g/dL (ref 13.0–17.0)
Lymphocytes Relative: 32 %
Lymphs Abs: 1.5 10*3/uL (ref 0.7–4.0)
MCH: 31 pg (ref 26.0–34.0)
MCHC: 33.8 g/dL (ref 30.0–36.0)
MCV: 91.7 fL (ref 78.0–100.0)
MONO ABS: 0.6 10*3/uL (ref 0.1–1.0)
Monocytes Relative: 12 %
NEUTROS ABS: 2.5 10*3/uL (ref 1.7–7.7)
NEUTROS PCT: 54 %
PLATELETS: 188 10*3/uL (ref 150–400)
RBC: 4.71 MIL/uL (ref 4.22–5.81)
RDW: 13.1 % (ref 11.5–15.5)
WBC: 4.7 10*3/uL (ref 4.0–10.5)

## 2015-09-17 LAB — COMPREHENSIVE METABOLIC PANEL
ALT: 18 U/L (ref 17–63)
AST: 22 U/L (ref 15–41)
Albumin: 3.9 g/dL (ref 3.5–5.0)
Alkaline Phosphatase: 41 U/L (ref 38–126)
Anion gap: 11 (ref 5–15)
BUN: 11 mg/dL (ref 6–20)
CHLORIDE: 106 mmol/L (ref 101–111)
CO2: 26 mmol/L (ref 22–32)
Calcium: 9.1 mg/dL (ref 8.9–10.3)
Creatinine, Ser: 1 mg/dL (ref 0.61–1.24)
Glucose, Bld: 102 mg/dL — ABNORMAL HIGH (ref 65–99)
POTASSIUM: 4.1 mmol/L (ref 3.5–5.1)
Sodium: 143 mmol/L (ref 135–145)
Total Bilirubin: 0.5 mg/dL (ref 0.3–1.2)
Total Protein: 6.2 g/dL — ABNORMAL LOW (ref 6.5–8.1)

## 2015-09-17 LAB — LIPASE, BLOOD: LIPASE: 22 U/L (ref 11–51)

## 2015-09-17 MED ORDER — ONDANSETRON HCL 4 MG/2ML IJ SOLN
4.0000 mg | Freq: Once | INTRAMUSCULAR | Status: AC
  Administered 2015-09-17: 4 mg via INTRAVENOUS
  Filled 2015-09-17: qty 2

## 2015-09-17 MED ORDER — IBUPROFEN 800 MG PO TABS: 800.0000 mg | ORAL_TABLET | Freq: Three times a day (TID) | ORAL | Status: DC

## 2015-09-17 MED ORDER — SODIUM CHLORIDE 0.9 % IV BOLUS (SEPSIS)
1000.0000 mL | Freq: Once | INTRAVENOUS | Status: AC
  Administered 2015-09-17: 1000 mL via INTRAVENOUS

## 2015-09-17 NOTE — ED Provider Notes (Signed)
CSN: [id number]     Arrival date & time 09/17/15  0932 History   First MD Initiated Contact with Patient 09/17/15 [phone]     Chief Complaint  Patient presents with  . Otalgia  . Headache  . Emesis     (Consider location/radiation/quality/duration/timing/severity/associated sxs/prior Treatment) Patient is a 35 y.o. male presenting with ear pain, headaches, and vomiting.  Otalgia Location:  Right Quality:  Aching and pressure Severity:  No pain Onset quality:  Gradual Duration:  4 days Timing:  Constant Chronicity:  New Context: direct blow   Relieved by:  None tried Worsened by:  Nothing tried Ineffective treatments:  None tried Associated symptoms: diarrhea, headaches and vomiting   Associated symptoms: no fever   Headache Associated symptoms: diarrhea, ear pain, nausea and vomiting   Associated symptoms: no fever   Emesis Associated symptoms: chills, diarrhea and headaches     Past Medical History  Diagnosis Date  . IBS (irritable bowel syndrome)   . Headache   . Back pain    Past Surgical History  Procedure Laterality Date  . Shoulder surgery     No family history on file. Social History  Substance Use Topics  . Smoking status: Current Some Day Smoker  . Smokeless tobacco: None  . Alcohol Use: No    Review of Systems  Constitutional: Positive for chills. Negative for fever.  HENT: Positive for ear pain.   Gastrointestinal: Positive for nausea, vomiting and diarrhea.  Skin: Negative for pallor and wound.  Neurological: Positive for headaches.  All other systems reviewed and are negative.     Allergies  Bee venom and Iodine  Home Medications   Prior to Admission medications   Medication Sig Start Date End Date Taking? Authorizing Provider  aspirin-acetaminophen-caffeine (EXCEDRIN MIGRAINE) [phone] MG per tablet Take 1 tablet by mouth every 6 (six) hours as needed for migraine.   Yes Historical Provider, MD  dicyclomine (BENTYL) 10 MG capsule  Take 10 mg by mouth daily as needed for spasms.   Yes Historical Provider, MD  traMADol (ULTRAM) 50 MG tablet Take one every 8-12 hours if needed for a headache not helped by your usual medicine 07/20/14  Yes Milton Ferguson, MD  HYDROcodone-acetaminophen (NORCO/VICODIN) 5-325 MG per tablet Take 1 tablet by mouth 3 (three) times daily as needed. 07/08/14   Historical Provider, MD  ibuprofen (ADVIL,MOTRIN) 800 MG tablet Take 1 tablet (800 mg total) by mouth 3 (three) times daily. 09/17/15   Merrily Pew, MD   BP 106/73 mmHg  Pulse 64  Temp(Src) 98.2 F (36.8 C) (Oral)  Resp 14  Ht 5' 10"  (1.778 m)  Wt 140 lb (63.504 kg)  BMI 20.09 kg/m2  SpO2 99% Physical Exam  Constitutional: He is oriented to person, place, and time. He appears well-developed and well-nourished.  HENT:  Head: Normocephalic and atraumatic.  Neck: Normal range of motion.  Cardiovascular: Normal rate.   Pulmonary/Chest: Effort normal and breath sounds normal. No respiratory distress.  Abdominal: Soft. Bowel sounds are normal. He exhibits no distension. There is no tenderness.  Musculoskeletal: Normal range of motion. He exhibits no edema or tenderness.  Neurological: He is alert and oriented to person, place, and time.  No altered mental status, able to give full seemingly accurate history.  Face is symmetric, EOM's intact, pupils equal and reactive, vision intact, tongue and uvula midline without deviation Upper and Lower extremity motor 5/5, intact pain perception in distal extremities, 2+ reflexes in biceps, patella and achilles tendons. Finger  to nose normal, heel to shin normal.  Skin: Skin is warm and dry. No rash noted.  Nursing note and vitals reviewed.   ED Course  Procedures (including critical care time) Labs Review Labs Reviewed  COMPREHENSIVE METABOLIC PANEL - Abnormal; Notable for the following:    Glucose, Bld 102 (*)    Total Protein 6.2 (*)    All other components within normal limits  CBC WITH  DIFFERENTIAL/PLATELET  LIPASE, BLOOD    Imaging Review Ct Head Wo Contrast  09/17/2015  CLINICAL DATA:  Hit in the head on Saturday, headache and dizziness since, initial encounter. EXAM: CT HEAD WITHOUT CONTRAST TECHNIQUE: Contiguous axial images were obtained from the base of the skull through the vertex without intravenous contrast. COMPARISON:  11/11/2008. FINDINGS: Brain: No evidence of an acute infarct, acute hemorrhage, mass lesion, mass effect or hydrocephalus. Vascular: No hyperdense vessel or unexpected calcification. Skull: Negative for fracture or focal lesion. Sinuses/Orbits: No acute findings. Other: None. IMPRESSION: Negative. Electronically Signed   By: Lorin Picket M.D.   On: 09/17/2015 12:49   I have personally reviewed and evaluated these images and lab results as part of my medical decision-making.   EKG Interpretation None      MDM   Final diagnoses:  Concussion, without loss of consciousness, initial encounter    Likely post concussive syndrome. No e/o fracture or head bleed on CT. Will give return precautions.  Also with likely gastroenteritis that seems to be improving. Had some coffee ground appearing emesis but no obvious bright red blood, normal hemoglobin here, doubt boerhaves or other significant cause of blood, possibly mallory weiss but is improving so no need for admission, will follow up with PCP if symptoms come back.   New Prescriptions: Discharge Medication List as of 09/17/2015  1:00 PM      I have personally and contemperaneously reviewed labs and imaging and used in my decision making as above.   A medical screening exam was performed and I feel the patient has had an appropriate workup for their chief complaint at this time and likelihood of emergent condition existing is low and thus workup can continue on an outpatient basis.. Their vital signs are stable. They have been counseled on decision, discharge, follow up and which symptoms  necessitate immediate return to the emergency department.  They verbally stated understanding and agreement with plan and discharged in stable condition.      Merrily Pew, MD 09/17/15 [phone]

## 2015-09-17 NOTE — ED Notes (Signed)
Pt c/o right ear pain, and headache-- was in a bounce house on Saturday at a birthday party and was hit on right side of head/ear area with an inflatable boxing glove-- also states has been throwing up coffee ground looking emesis--family has been vomiting and having diarrhea.

## 2015-11-03 ENCOUNTER — Ambulatory Visit: Payer: Self-pay | Admitting: Gastroenterology

## 2015-11-03 ENCOUNTER — Telehealth: Payer: Self-pay | Admitting: *Deleted

## 2015-11-03 NOTE — Telephone Encounter (Signed)
Mailed out missed appointment letter today, 11-03-2015.  Provider: Alonza Bogus PA-C.

## 2017-05-23 HISTORY — PX: COLONOSCOPY: SHX174

## 2018-06-28 ENCOUNTER — Encounter: Payer: Self-pay | Admitting: Gastroenterology

## 2018-07-20 ENCOUNTER — Encounter: Payer: Self-pay | Admitting: Gastroenterology

## 2018-07-20 ENCOUNTER — Ambulatory Visit: Payer: Self-pay | Admitting: Gastroenterology

## 2018-07-20 VITALS — BP 90/60 | HR 88 | Ht 70.0 in | Wt 142.0 lb

## 2018-07-20 DIAGNOSIS — K625 Hemorrhage of anus and rectum: Secondary | ICD-10-CM

## 2018-07-20 DIAGNOSIS — R195 Other fecal abnormalities: Secondary | ICD-10-CM

## 2018-07-20 NOTE — Progress Notes (Signed)
Referring Provider: Lavonne Chick, MD Primary Care Physician:  Laneta Simmers, NP   Reason for Consultation:  Rectal bleeding   IMPRESSION:  Rectal bleeding, initially started in 2017 Recent change in stool caliber - ribbon-like for 2-3 months NSAID use for back pain And family history of colon cancer or polyps  The differential for rectal bleeding is broad.  It includes outlet sources such as fissure or hemorrhoids, as well as polyps, mass, ulcers, and colitis.  He has no extra GI manifestations of inflammatory bowel disease.  Given this differential I am recommending a colonoscopy.  PLAN: Colonoscopy  I consented the patient at the bedside today discussing the risks, benefits, and alternatives to endoscopic evaluation. In particular, we discussed the risks that include, but are not limited to, reaction to medication, cardiopulmonary compromise, bleeding requiring blood transfusion, aspiration resulting in pneumonia, perforation requiring surgery, lack of diagnosis, severe illness requiring hospitalization, and even death. We reviewed the risk of missed lesion including polyps or even cancer. The patient acknowledges these risks and asks that we proceed.  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 Tobin Chad for further evaluation of rectal bleeding.  The history is obtained through the patient and review of his referral records.  He has a history of back pain with radiculopathy, satiety, migraines, and tobacco habituation.  Initially mentioned rectal bleeding to his PCP in 2017. Occurring more frequently over the last couple of months. Initially red on the toilet paper. Now blood in the sides of the toilet.    One formed BM daily to every 3 days.  Over last couple of months they have become more ribbon like.  Prior to that they were formed.  No constipation or diarrhea.  Abdominal pain.  No mucus.  No rectal pain or tenesmus.  He has some loss of  appetite.  His weight has fluctuated.  No other associated symptoms. No identified exacerbating or relieving features.   Hemoglobin 14.1 on June 27, 2018.  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   No known family history of colon cancer or polyps.  He is concerned about the possibility of cancer.  Past Medical History:  Diagnosis Date  . Back pain   . Headache   . IBS (irritable bowel syndrome)     Past Surgical History:  Procedure Laterality Date  . SHOULDER SURGERY      Current Outpatient Medications  Medication Sig Dispense Refill  . HYDROcodone-acetaminophen (NORCO/VICODIN) 5-325 MG per tablet Take 1 tablet by mouth 3 (three) times daily as needed.  0  . sertraline (ZOLOFT) 100 MG tablet Take 100 mg by mouth daily.    Marland Kitchen aspirin-acetaminophen-caffeine (EXCEDRIN MIGRAINE) 250-250-65 MG per tablet Take 1 tablet by mouth every 6 (six) hours as needed for migraine.    . dicyclomine (BENTYL) 10 MG capsule Take 10 mg by mouth daily as needed for spasms.    Marland Kitchen ibuprofen (ADVIL,MOTRIN) 800 MG tablet Take 1 tablet (800 mg total) by mouth 3 (three) times daily. (Patient not taking: Reported on 07/20/2018) 21 tablet 0  . traMADol (ULTRAM) 50 MG tablet Take one every 8-12 hours if needed for a headache not helped by your usual medicine (Patient not taking: Reported on 07/20/2018) 10 tablet 0   No current facility-administered medications for this visit.     Allergies as of 07/20/2018 - Review Complete 07/20/2018  Allergen Reaction Noted  . Bee venom Anaphylaxis 01/09/2015  .  Iodine Anaphylaxis 01/09/2015    Family History  Problem Relation Age of Onset  . Diabetes Mother   . Cancer Maternal Grandfather        not sure what king  . Diabetes Maternal Grandfather     Social History   Socioeconomic History  . Marital status: Single    Spouse name: Not on file  . Number of children: Not on file  . Years of education: Not on file  . Highest education level: Not on  file  Occupational History  . Not on file  Social Needs  . Financial resource strain: Not on file  . Food insecurity:    Worry: Not on file    Inability: Not on file  . Transportation needs:    Medical: Not on file    Non-medical: Not on file  Tobacco Use  . Smoking status: Current Some Day Smoker  . Smokeless tobacco: Never Used  Substance and Sexual Activity  . Alcohol use: No  . Drug use: Yes    Types: Marijuana  . Sexual activity: Not on file  Lifestyle  . Physical activity:    Days per week: Not on file    Minutes per session: Not on file  . Stress: Not on file  Relationships  . Social connections:    Talks on phone: Not on file    Gets together: Not on file    Attends religious service: Not on file    Active member of club or organization: Not on file    Attends meetings of clubs or organizations: Not on file    Relationship status: Not on file  . Intimate partner violence:    Fear of current or ex partner: Not on file    Emotionally abused: Not on file    Physically abused: Not on file    Forced sexual activity: Not on file  Other Topics Concern  . Not on file  Social History Narrative  . Not on file    Review of Systems: 12 system ROS is negative except as noted above additions of allergies, back pain, fatigue, depression, headaches, insomnia.  Filed Weights   07/20/18 1500  Weight: 142 lb (64.4 kg)    Physical Exam: Vital signs were reviewed. General:   Alert, well-nourished, pleasant and cooperative in NAD Head:  Normocephalic and atraumatic. Eyes:  Sclera clear, no icterus.   Conjunctiva pink. Mouth:  No deformity or lesions.  Edentulous.  Neck:  Supple; no thyromegaly. Lungs:  Clear throughout to auscultation.   No wheezes.  Heart:  Regular rate and rhythm; no murmurs Abdomen:  Soft, nontender, normal bowel sounds. No rebound or guarding. No hepatosplenomegaly Rectal:  Deferred  Msk:  Symmetrical without gross deformities. Extremities:  No  gross deformities or edema. Neurologic:  Alert and  oriented x4;  grossly nonfocal Skin:  No rash or bruise. Tattoos.  Psych:  Alert and cooperative. Normal mood and affect.   Adilenne Ashworth L. Tarri Glenn, MD, MPH [facility] 07/27/2018, 8:39 AM

## 2018-07-20 NOTE — Patient Instructions (Addendum)
If you are age 40 or older, your body mass index should be between 23-30. Your Body mass index is 20.37 kg/m. If this is out of the aforementioned range listed, please consider follow up with your Primary Care Provider.  If you are age 62 or younger, your body mass index should be between 19-25. Your Body mass index is 20.37 kg/m. If this is out of the aformentioned range listed, please consider follow up with your Primary Care Provider.   You have been scheduled for a colonoscopy. Please follow written instructions given to you at your visit today.  Please pick up your prep supplies at the pharmacy within the next 1-3 days. If you use inhalers (even only as needed), please bring them with you on the day of your procedure. Your physician has requested that you go to www.startemmi.com and enter the access code given to you at your visit today. This web site gives a general overview about your procedure. However, you should still follow specific instructions given to you by our office regarding your preparation for the procedure.  We have given you samples of the following medication to take: Suprep  Thank you for choosing me and [facility].  Dr. Thornton Park

## 2018-07-27 ENCOUNTER — Encounter: Payer: Self-pay | Admitting: Gastroenterology

## 2018-07-31 ENCOUNTER — Encounter: Payer: Self-pay | Admitting: Gastroenterology

## 2018-07-31 ENCOUNTER — Other Ambulatory Visit: Payer: Self-pay

## 2018-07-31 ENCOUNTER — Ambulatory Visit (AMBULATORY_SURGERY_CENTER): Payer: Self-pay | Admitting: Gastroenterology

## 2018-07-31 VITALS — BP 95/44 | HR 61 | Temp 98.6°F | Resp 14 | Ht 70.0 in | Wt 142.0 lb

## 2018-07-31 DIAGNOSIS — D123 Benign neoplasm of transverse colon: Secondary | ICD-10-CM

## 2018-07-31 DIAGNOSIS — K529 Noninfective gastroenteritis and colitis, unspecified: Secondary | ICD-10-CM

## 2018-07-31 DIAGNOSIS — K625 Hemorrhage of anus and rectum: Secondary | ICD-10-CM

## 2018-07-31 DIAGNOSIS — K639 Disease of intestine, unspecified: Secondary | ICD-10-CM

## 2018-07-31 MED ORDER — SODIUM CHLORIDE 0.9 % IV SOLN: 500.0000 mL | Freq: Once | INTRAVENOUS | Status: DC

## 2018-07-31 NOTE — Patient Instructions (Addendum)
STOP using all NSAIDS   Handouts Provided:  Polyps  YOU HAD AN ENDOSCOPIC PROCEDURE TODAY AT [facility]:   Refer to the procedure report that was given to you for any specific questions about what was found during the examination.  If the procedure report does not answer your questions, please call your gastroenterologist to clarify.  If you requested that your care partner not be given the details of your procedure findings, then the procedure report has been included in a sealed envelope for you to review at your convenience later.  YOU SHOULD EXPECT: Some feelings of bloating in the abdomen. Passage of more gas than usual.  Walking can help get rid of the air that was put into your GI tract during the procedure and reduce the bloating. If you had a lower endoscopy (such as a colonoscopy or flexible sigmoidoscopy) you may notice spotting of blood in your stool or on the toilet paper. If you underwent a bowel prep for your procedure, you may not have a normal bowel movement for a few days.  Please Note:  You might notice some irritation and congestion in your nose or some drainage.  This is from the oxygen used during your procedure.  There is no need for concern and it should clear up in a day or so.  SYMPTOMS TO REPORT IMMEDIATELY:   Following lower endoscopy (colonoscopy or flexible sigmoidoscopy):  Excessive amounts of blood in the stool  Significant tenderness or worsening of abdominal pains  Swelling of the abdomen that is new, acute  Fever of 100F or higher  For urgent or emergent issues, a gastroenterologist can be reached at any hour by calling [phone].   DIET:  We do recommend a small meal at first, but then you may proceed to your regular diet.  Drink plenty of fluids but you should avoid alcoholic beverages for 24 hours.  ACTIVITY:  You should plan to take it easy for the rest of today and you should NOT DRIVE or use heavy machinery until tomorrow  (because of the sedation medicines used during the test).    FOLLOW UP: Our staff will call the number listed on your records the next business day following your procedure to check on you and address any questions or concerns that you may have regarding the information given to you following your procedure. If we do not reach you, we will leave a message.  However, if you are feeling well and you are not experiencing any problems, there is no need to return our call.  We will assume that you have returned to your regular daily activities without incident.  If any biopsies were taken you will be contacted by phone or by letter within the next 1-3 weeks.  Please call us at [phone] if you have not heard about the biopsies in 3 weeks.    SIGNATURES/CONFIDENTIALITY: You and/or your care partner have signed paperwork which will be entered into your electronic medical record.  These signatures attest to the fact that that the information above on your After Visit Summary has been reviewed and is understood.  Full responsibility of the confidentiality of this discharge information lies with you and/or your care-partner.

## 2018-07-31 NOTE — Progress Notes (Signed)
PT taken to PACU. Monitors in place. VSS. Report given to RN. 

## 2018-07-31 NOTE — Op Note (Signed)
[facility] Patient Name: Brian Hess Procedure Date: 07/31/2018 10:17 AM MRN: [id number] Endoscopist: Thornton Park MD, MD Age: 38 Referring MD:  Date of Birth: 07-May-1981 Gender: Male Account #: [id number] Procedure:                Colonoscopy Indications:              Rectal bleeding.                           Recent change in stool caliber - ribbon-like for                            2-3 months                           NSAID use for back pain                           No family history of colon cancer or polyps Medicines:                See the Anesthesia note for documentation of the                            administered medications Procedure:                Pre-Anesthesia Assessment:                           - Prior to the procedure, a History and Physical                            was performed, and patient medications and                            allergies were reviewed. The patient's tolerance of                            previous anesthesia was also reviewed. The risks                            and benefits of the procedure and the sedation                            options and risks were discussed with the patient.                            All questions were answered, and informed consent                            was obtained. Prior Anticoagulants: The patient has                            taken no previous anticoagulant or antiplatelet                            agents.  ASA Grade Assessment: II - A patient with                            mild systemic disease. After reviewing the risks                            and benefits, the patient was deemed in                            satisfactory condition to undergo the procedure.                           After obtaining informed consent, the colonoscope                            was passed under direct vision. Throughout the                            procedure, the patient's blood pressure,  pulse, and                            oxygen saturations were monitored continuously. The                            Model CF-[id number]) scope was introduced                            through the anus and advanced to the the terminal                            ileum, with identification of the appendiceal                            orifice and IC valve. Approximately 10cm of                            terminal ileum was examinedThe colonoscopy was                            performed without difficulty. The patient tolerated                            the procedure well. The quality of the bowel                            preparation was excellent. The terminal ileum,                            ileocecal valve, appendiceal orifice, and rectum                            were photographed. Scope In: 10:23:29 AM Scope Out: 10:38:49 AM Scope Withdrawal Time: 0 hours 13 minutes 2 seconds  Total Procedure Duration: 0 hours 15 minutes  20 seconds  Findings:                 A 4 mm polyp was found in the transverse colon. The                            polyp was sessile. The polyp was removed with a                            cold snare. Resection and retrieval were complete.                            Estimated blood loss: none.                           The colon (entire examined portion) appeared                            normal. Biopsies were taken with a cold forceps for                            histology from the left colon, transverse, and                            right colon. Estimated blood loss was minimal.                           A diffuse area of mucosa in the distal ileum was                            mildly erythematous. Biopsies were taken with a                            cold forceps for histology. Estimated blood loss                            was minimal.                           The exam was otherwise without abnormality on                            direct  and retroflexion views. Small internal                            hemorrhoids were identified. Complications:            No immediate complications. Estimated blood loss:                            Minimal. Estimated Blood Loss:     Estimated blood loss was minimal. Impression:               - One 4 mm polyp in the transverse colon, removed  with a cold snare. Resected and retrieved.                           - The entire examined colon is normal. Biopsied.                           - Erythematous mucosa in the distal ileum. Biopsied                            to exclude Crohn's disease, however, the findings                            may represent changes related to NSAIDs.                           - The examination was otherwise normal on direct                            and retroflexion views. Recommendation:           - Patient has a contact number available for                            emergencies. The signs and symptoms of potential                            delayed complications were discussed with the                            patient. Return to normal activities tomorrow.                            Written discharge instructions were provided to the                            patient.                           - Resume regular diet.                           - Continue present medications except stop using                            all NSAIDs.                           - Await pathology results.                           - Repeat colonoscopy in 7 years for surveillance if                            the polyp is adenomatous.                           -  Return to GI office to review these results. Thornton Park MD, MD 07/31/2018 10:49:32 AM This report has been signed electronically.

## 2018-07-31 NOTE — Progress Notes (Signed)
Called to room to assist during endoscopic procedure.  Patient ID and intended procedure confirmed with present staff. Received instructions for my participation in the procedure from the performing physician.  

## 2018-07-31 NOTE — Progress Notes (Signed)
Pt's states no medical or surgical changes since previsit or office visit. 

## 2018-08-01 ENCOUNTER — Telehealth: Payer: Self-pay

## 2018-08-01 NOTE — Telephone Encounter (Signed)
  Follow up Call-  Call back number 07/31/2018  Post procedure Call Back phone  # [phone]  Permission to leave phone message Yes  Some recent data might be hidden     Patient questions:  Do you have a fever, pain , or abdominal swelling? No. Pain Score  0 *  Have you tolerated food without any problems? Yes.    Have you been able to return to your normal activities? Yes.    Do you have any questions about your discharge instructions: Diet   No. Medications  No. Follow up visit  No.  Do you have questions or concerns about your Care? No.  Actions: * If pain score is 4 or above: No action needed, pain <4.

## 2018-08-06 ENCOUNTER — Other Ambulatory Visit: Payer: Self-pay

## 2018-08-06 DIAGNOSIS — K625 Hemorrhage of anus and rectum: Secondary | ICD-10-CM

## 2018-08-29 ENCOUNTER — Other Ambulatory Visit: Payer: Self-pay

## 2018-08-29 ENCOUNTER — Ambulatory Visit (INDEPENDENT_AMBULATORY_CARE_PROVIDER_SITE_OTHER): Payer: Self-pay | Admitting: Gastroenterology

## 2018-08-29 DIAGNOSIS — R194 Change in bowel habit: Secondary | ICD-10-CM

## 2018-08-29 DIAGNOSIS — K5000 Crohn's disease of small intestine without complications: Secondary | ICD-10-CM

## 2018-08-29 DIAGNOSIS — K625 Hemorrhage of anus and rectum: Secondary | ICD-10-CM

## 2018-08-29 DIAGNOSIS — Z86010 Personal history of colonic polyps: Secondary | ICD-10-CM

## 2018-08-29 DIAGNOSIS — K648 Other hemorrhoids: Secondary | ICD-10-CM

## 2018-08-29 NOTE — Patient Instructions (Addendum)
Avoid NSAIDs.  Acetaminophen recommended instead of NSAIDs.

## 2018-08-29 NOTE — Progress Notes (Signed)
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   Edema noted in the terminal ileum was likely due to NSAIDs.  Proceed with evaluation of possible small bowel Crohn's with any recurrent symptoms.  Conservative therapy of internal hemorrhoids recommended for any further bleeding.  PLAN: Avoid all NSAIDs Acetaminophen recommended instead ESR, CRP, and fecal calprotectin if symptoms return/worsen He is asked to call with any questions or concerns Surveillance colonoscopy in 2027  HPI: Brian Hess is a 38 y.o. male recently evaluated for rectal bleeding and a change in stool caliber.   Colonoscopy 07/31/2018 showed a 4 mm transverse colon adenoma, mild distal ileitis and was otherwise normal.  Internal hemorrhoids were present.  Surveillance endoscopy recommended in 7 years.  Following the colonoscopy, his bowel habits have returned to normal. No blood. Stools have returned to the normal shape and texture. One bowel movement daily. Occassional BID bowel movement with large quantities of food.   Thought he was avoiding all NSAIDs after his colonoscopy but he has been using Excedrin for an occasional headache.  Past Medical  History:  Diagnosis Date  . Back pain   . Headache   . IBS (irritable bowel syndrome)     Past Surgical History:  Procedure Laterality Date  . SHOULDER SURGERY      Current Outpatient Medications  Medication Sig Dispense Refill  . aspirin-acetaminophen-caffeine (EXCEDRIN MIGRAINE) 250-250-65 MG per tablet Take 1 tablet by mouth every 6 (six) hours as needed for migraine.    . dicyclomine (BENTYL) 10 MG capsule Take 10 mg by mouth daily as needed for spasms.    Marland Kitchen HYDROcodone-acetaminophen (NORCO/VICODIN) 5-325 MG per tablet Take 1 tablet by mouth 3 (three) times daily as needed.  0  . ibuprofen (ADVIL,MOTRIN) 800 MG tablet Take 1 tablet (800 mg total) by mouth 3 (three) times daily. (Patient not taking: Reported on 07/20/2018) 21 tablet 0  . sertraline (ZOLOFT) 100 MG tablet Take 100 mg by mouth daily.    . traMADol (ULTRAM) 50 MG tablet Take one every 8-12 hours if needed for a headache not helped by your usual medicine (Patient not taking: Reported on 07/20/2018) 10 tablet 0   No current facility-administered medications for this visit.     Allergies as of 08/29/2018 - Review Complete 07/31/2018  Allergen Reaction Noted  . Bee venom Anaphylaxis 01/09/2015  . Iodine Anaphylaxis 01/09/2015    Family History  Problem Relation Age of Onset  . Diabetes Mother   . Cancer Maternal Grandfather        not sure what king  . Diabetes Maternal Grandfather     Social History   Socioeconomic History  . Marital status: Single    Spouse name: Not on file  . Number  of children: Not on file  . Years of education: Not on file  . Highest education level: Not on file  Occupational History  . Not on file  Social Needs  . Financial resource strain: Not on file  . Food insecurity:    Worry: Not on file    Inability: Not on file  . Transportation needs:    Medical: Not on file    Non-medical: Not on file  Tobacco Use  . Smoking status: Current Some Day Smoker  . Smokeless tobacco:  Never Used  Substance and Sexual Activity  . Alcohol use: No  . Drug use: Yes    Types: Marijuana  . Sexual activity: Not on file  Lifestyle  . Physical activity:    Days per week: Not on file    Minutes per session: Not on file  . Stress: Not on file  Relationships  . Social connections:    Talks on phone: Not on file    Gets together: Not on file    Attends religious service: Not on file    Active member of club or organization: Not on file    Attends meetings of clubs or organizations: Not on file    Relationship status: Not on file  . Intimate partner violence:    Fear of current or ex partner: Not on file    Emotionally abused: Not on file    Physically abused: Not on file    Forced sexual activity: Not on file  Other Topics Concern  . Not on file  Social History Narrative  . Not on file    Review of Systems: ALL ROS discussed and all others negative except listed in HPI.  Physical Exam: General: in no acute distress Neuro: Alert and appropriate Psych: Normal affect and normal insight   Lindora Alviar L. Tarri Glenn, MD, MPH [facility] 08/29/2018, 2:53 PM

## 2018-09-04 ENCOUNTER — Encounter: Payer: Self-pay | Admitting: Gastroenterology

## 2018-09-26 ENCOUNTER — Other Ambulatory Visit (INDEPENDENT_AMBULATORY_CARE_PROVIDER_SITE_OTHER): Payer: Self-pay

## 2018-09-26 ENCOUNTER — Telehealth: Payer: Self-pay | Admitting: Gastroenterology

## 2018-09-26 DIAGNOSIS — K625 Hemorrhage of anus and rectum: Secondary | ICD-10-CM

## 2018-09-26 LAB — SEDIMENTATION RATE: Sed Rate: 7 mm/hr (ref 0–15)

## 2018-09-26 LAB — HIGH SENSITIVITY CRP: CRP, High Sensitivity: 1.74 mg/L (ref 0.000–5.000)

## 2018-09-26 NOTE — Telephone Encounter (Signed)
Per the pt he states his symptoms have not resolved and may be somewhat worse.  He was advised to come in for lab work as recommended by Dr Tarri Glenn at his telephone visit.  He will be contacted as soon as results are reviewed.    PLAN: Avoid all NSAIDs Acetaminophen recommended instead ESR, CRP, and fecal calprotectin if symptoms return/worsen He is asked to call with any questions or concerns Surveillance colonoscopy in 2027

## 2018-09-26 NOTE — Telephone Encounter (Signed)
Patient said that the same symptoms he had before the colonoscopy have came back more aggressive. And would like to speak to someone.

## 2018-09-26 NOTE — Telephone Encounter (Signed)
Forward to Dr. Tarri Glenn nurse

## 2018-09-28 ENCOUNTER — Other Ambulatory Visit: Payer: Self-pay

## 2018-09-28 DIAGNOSIS — K625 Hemorrhage of anus and rectum: Secondary | ICD-10-CM

## 2018-10-04 LAB — CALPROTECTIN, FECAL: Calprotectin, Fecal: 133 ug/g — ABNORMAL HIGH (ref 0–120)

## 2018-10-12 NOTE — Progress Notes (Signed)
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 Colonoscopy revealed only TI ieitis that I had attributed to probably NSAIDs, which he continues to use.  CRP was 1.74, sed rate 7, and fecal calprotectin 133.  Will proceed with evaluation for possible Crohn's given his ongoing symptoms.  Conservative therapy of internal hemorrhoids recommended.  PLAN: Avoid all NSAIDs including Excedrin Fecal calprotectin CBC given his ongoing bleeding Anusol HC suppositories BID x 2 weeks Acetaminophen recommended instead for headaches Review headache treatment options with PCP CT Enterography/Abd/Pelvis with contrast for abdominal pain  Surveillance colonoscopy in 2027 Follow-up encounter after the CT scan  HPI: Brian Hess is a 38 y.o. male recently evaluated for rectal bleeding and a change in stool caliber.   Colonoscopy 07/31/2018 showed a 4 mm transverse colon adenoma, mild distal ileitis and was otherwise normal.  Internal hemorrhoids were present.  Surveillance endoscopy recommended in 7 years.  Following the colonoscopy, his bowel habits  initially returned to normal and he had no further bleeding. Stools had returned to the normal shape and texture and he was having one bowel movement daily. Occassional BID bowel movement with large quantities of food.   He called with worsening symptoms last month with intermittent bleeding and return of ribbon-y stools. Symptoms were identical to those that had initially occurred.  Will have severe symptoms for several days followed by several days of being symptom free. He has a return of liquid stools. No identifying triggers. No mucous. Appetite is good. Weight is stable. No identified extra-GI manifestations of IBD.   Labs 09/28/18: CRP was 1.74, sed rate 7, fecal calprotectin 133.  He continues to use excedrin for headaches.  Past Medical History:  Diagnosis Date  . Back pain   . Headache   . IBS (irritable bowel syndrome)     Past Surgical History:  Procedure Laterality Date  . SHOULDER SURGERY      Current Outpatient Medications  Medication Sig Dispense Refill  . aspirin-acetaminophen-caffeine (EXCEDRIN MIGRAINE) 250-250-65 MG per tablet Take 1 tablet by mouth every 6 (six) hours as needed for migraine.    . dicyclomine (BENTYL) 10 MG capsule Take 10 mg by mouth daily as needed for spasms.    Marland Kitchen HYDROcodone-acetaminophen (NORCO/VICODIN) 5-325 MG per tablet Take 1 tablet by mouth 3 (three) times daily as needed.  0  . ibuprofen (ADVIL,MOTRIN) 800 MG tablet Take 1 tablet (800 mg total) by mouth 3 (three) times daily. (Patient not taking: Reported on 07/20/2018) 21 tablet 0  . sertraline (ZOLOFT) 100 MG tablet Take 100 mg by mouth daily.    . traMADol (ULTRAM) 50 MG tablet Take one every 8-12  hours if needed for a headache not helped by your usual medicine (Patient not taking: Reported on 07/20/2018) 10 tablet 0   No current facility-administered medications for this visit.     Allergies as of 10/17/2018 - Review Complete 09/04/2018  Allergen Reaction Noted  . Bee venom Anaphylaxis  01/09/2015  . Iodine Anaphylaxis 01/09/2015    Family History  Problem Relation Age of Onset  . Diabetes Mother   . Cancer Maternal Grandfather        not sure what king  . Diabetes Maternal Grandfather     Social History   Socioeconomic History  . Marital status: Single    Spouse name: Not on file  . Number of children: Not on file  . Years of education: Not on file  . Highest education level: Not on file  Occupational History  . Not on file  Social Needs  . Financial resource strain: Not on file  . Food insecurity:    Worry: Not on file    Inability: Not on file  . Transportation needs:    Medical: Not on file    Non-medical: Not on file  Tobacco Use  . Smoking status: Current Some Day Smoker  . Smokeless tobacco: Never Used  Substance and Sexual Activity  . Alcohol use: No  . Drug use: Yes    Types: Marijuana  . Sexual activity: Not on file  Lifestyle  . Physical activity:    Days per week: Not on file    Minutes per session: Not on file  . Stress: Not on file  Relationships  . Social connections:    Talks on phone: Not on file    Gets together: Not on file    Attends religious service: Not on file    Active member of club or organization: Not on file    Attends meetings of clubs or organizations: Not on file    Relationship status: Not on file  . Intimate partner violence:    Fear of current or ex partner: Not on file    Emotionally abused: Not on file    Physically abused: Not on file    Forced sexual activity: Not on file  Other Topics Concern  . Not on file  Social History Narrative  . Not on file    Review of Systems: ALL ROS discussed and all others negative except listed in HPI.  Physical Exam: General: in no acute distress Neuro: Alert and appropriate Psych: Normal affect and normal insight   Melenda Bielak L. Tarri Glenn, MD, MPH [facility] 10/12/2018, 3:31 PM

## 2018-10-16 ENCOUNTER — Encounter: Payer: Self-pay | Admitting: General Surgery

## 2018-10-17 ENCOUNTER — Other Ambulatory Visit: Payer: Self-pay

## 2018-10-17 ENCOUNTER — Encounter: Payer: Self-pay | Admitting: Gastroenterology

## 2018-10-17 ENCOUNTER — Ambulatory Visit (INDEPENDENT_AMBULATORY_CARE_PROVIDER_SITE_OTHER): Payer: Self-pay | Admitting: Gastroenterology

## 2018-10-17 VITALS — Ht 71.0 in | Wt 140.0 lb

## 2018-10-17 DIAGNOSIS — R194 Change in bowel habit: Secondary | ICD-10-CM

## 2018-10-17 DIAGNOSIS — K5000 Crohn's disease of small intestine without complications: Secondary | ICD-10-CM

## 2018-10-17 DIAGNOSIS — K625 Hemorrhage of anus and rectum: Secondary | ICD-10-CM

## 2018-10-17 MED ORDER — HYDROCORTISONE ACETATE 25 MG RE SUPP: 25.0000 mg | Freq: Two times a day (BID) | RECTAL | 1 refills | Status: DC

## 2018-10-17 NOTE — Patient Instructions (Addendum)
Avoid all antinflammatory medications including Excedrin and aspirin. Please talk to Brian Hess to see if she alternative suggestions to treatment your migraines.  Return for a fecal calprotectin test and a blood test for anemia at your convenience.   I am recommending a CT scan for further evaluation.   Anusol HC suppositories twice daily may help with your bleeding. These have been sent to your pharmacy.   Let's plan to follow-up after the CT scan.  Please call me with any questions or concerns in the meantime.

## 2018-11-27 ENCOUNTER — Telehealth: Payer: Self-pay | Admitting: Emergency Medicine

## 2018-11-27 DIAGNOSIS — K5000 Crohn's disease of small intestine without complications: Secondary | ICD-10-CM

## 2018-11-27 MED ORDER — PREDNISONE 50 MG PO TABS: 50.0000 mg | ORAL_TABLET | ORAL | 0 refills | Status: DC

## 2018-11-27 NOTE — Telephone Encounter (Signed)
Received call from [place] that patient is allergic to Iodine and cannot tolerate oral contrast. Will need to have CT at hospital and pre-medicate beforehand. Patient informed. Will mail him instructions on how to premedicate and new date of CT.

## 2018-11-29 ENCOUNTER — Other Ambulatory Visit: Payer: Self-pay

## 2018-12-05 ENCOUNTER — Other Ambulatory Visit: Payer: Self-pay | Admitting: Emergency Medicine

## 2018-12-05 ENCOUNTER — Telehealth: Payer: Self-pay | Admitting: Emergency Medicine

## 2018-12-05 MED ORDER — PREDNISONE 50 MG PO TABS: 50.0000 mg | ORAL_TABLET | ORAL | 0 refills | Status: DC

## 2018-12-05 NOTE — Telephone Encounter (Signed)
Spoke with patient he states he does not get paid until next week and wanted to know how much medication and copay for the hospital would cost him. He prefers to reschedule until after he gets paid next week. Requested Rx be sent to [facility] on [address]. Patient states he has instructions on how to prep and verbalized understanding.

## 2018-12-06 ENCOUNTER — Ambulatory Visit (HOSPITAL_COMMUNITY): Admission: RE | Admit: 2018-12-06 | Payer: Self-pay | Source: Ambulatory Visit

## 2018-12-13 ENCOUNTER — Ambulatory Visit (HOSPITAL_COMMUNITY): Admission: RE | Admit: 2018-12-13 | Payer: Self-pay | Source: Ambulatory Visit

## 2019-06-07 DIAGNOSIS — R569 Unspecified convulsions: Secondary | ICD-10-CM

## 2019-06-07 HISTORY — DX: Unspecified convulsions: R56.9

## 2020-02-21 DIAGNOSIS — R4586 Emotional lability: Secondary | ICD-10-CM

## 2020-02-21 HISTORY — DX: Emotional lability: R45.86

## 2020-03-06 ENCOUNTER — Other Ambulatory Visit: Payer: Self-pay

## 2020-03-06 ENCOUNTER — Ambulatory Visit (INDEPENDENT_AMBULATORY_CARE_PROVIDER_SITE_OTHER): Payer: 59 | Admitting: Medical

## 2020-03-06 ENCOUNTER — Encounter: Payer: Self-pay | Admitting: Medical

## 2020-03-06 VITALS — BP 126/76 | HR 87 | Ht 71.0 in | Wt 151.4 lb

## 2020-03-06 DIAGNOSIS — Z0000 Encounter for general adult medical examination without abnormal findings: Secondary | ICD-10-CM | POA: Diagnosis not present

## 2020-03-06 DIAGNOSIS — R200 Anesthesia of skin: Secondary | ICD-10-CM | POA: Diagnosis not present

## 2020-03-06 DIAGNOSIS — F17200 Nicotine dependence, unspecified, uncomplicated: Secondary | ICD-10-CM | POA: Diagnosis not present

## 2020-03-06 DIAGNOSIS — F325 Major depressive disorder, single episode, in full remission: Secondary | ICD-10-CM

## 2020-03-06 DIAGNOSIS — Z13220 Encounter for screening for lipoid disorders: Secondary | ICD-10-CM

## 2020-03-06 DIAGNOSIS — M545 Low back pain, unspecified: Secondary | ICD-10-CM

## 2020-03-06 DIAGNOSIS — G8929 Other chronic pain: Secondary | ICD-10-CM

## 2020-03-06 DIAGNOSIS — Z7185 Encounter for immunization safety counseling: Secondary | ICD-10-CM | POA: Insufficient documentation

## 2020-03-06 HISTORY — DX: Low back pain, unspecified: M54.50

## 2020-03-06 HISTORY — DX: Major depressive disorder, single episode, in full remission: F32.5

## 2020-03-06 HISTORY — DX: Encounter for general adult medical examination without abnormal findings: Z00.00

## 2020-03-06 HISTORY — DX: Encounter for screening for lipoid disorders: Z13.220

## 2020-03-06 HISTORY — DX: Anesthesia of skin: R20.0

## 2020-03-06 HISTORY — DX: Encounter for immunization safety counseling: Z71.85

## 2020-03-06 HISTORY — DX: Nicotine dependence, unspecified, uncomplicated: F17.200

## 2020-03-06 NOTE — Progress Notes (Signed)
New patient visit    Patient: SHAHZAD Hess   DOB: 1980/09/08   39 y.o. Male  MRN: [id number] Visit Date: 03/06/2020  Today's healthcare provider: Dorothea Ogle, PA-C   Chief Complaint  Patient presents with  . New Patient (Initial Visit)    establish care   . Annual Exam    with fasting labs    Subjective    Brian Hess is a 39 y.o. male who presents today as a new patient to establish care.  HPI HPI    New Patient (Initial Visit)     Additional comments: establish care           Annual Exam     Additional comments: with fasting labs        Last edited by Edgar Frisk, [place] on 03/06/2020  2:13 PM. (History)      Has a spot in his back that is continuing to be a problem.  Wants this rechecked. Outer thighs always seems to be numb.  Left leg numb for years in upper outer, but right leg starting to get this way.  Used to be on hydrocodone 3 times a day.  Gets bad nightmares  Last tetanus booster last 5 years     Past Medical History:  Diagnosis Date  . Back pain   . Headache   . IBS (irritable bowel syndrome)   . Mood change 02/2020   doing fine on Setraline, but in years prior several prior medications   Past Surgical History:  Procedure Laterality Date  . COLONOSCOPY  2019   blood in stool  . SHOULDER SURGERY  1999   right   Family Status  Relation Name Status  . Mother  Alive  . MGF  (Not Specified)  . Father  Alive  . Sister  (Not Specified)  . Mat Aunt  (Not Specified)  . MGM  (Not Specified)  . PGM  Deceased  . PGF  (Not Specified)  . Neg Hx  (Not Specified)   Family History  Problem Relation Age of Onset  . Diabetes Mother   . Depression Mother   . Cancer Maternal Grandfather        not sure what king  . Diabetes Maternal Grandfather   . Depression Father   . Other Father        electrocution, brain damage and finger amputation  . Diabetes Sister   . Diabetes Maternal Aunt   . Heart disease Maternal Grandmother         pacemaker  . Cancer Paternal Grandmother        breast  . Heart disease Paternal Grandfather   . Colon polyps Neg Hx   . Esophageal cancer Neg Hx   . Pancreatic cancer Neg Hx   . Rectal cancer Neg Hx   . Stomach cancer Neg Hx    Social History   Socioeconomic History  . Marital status: Married    Spouse name: Not on file  . Number of children: Not on file  . Years of education: Not on file  . Highest education level: Not on file  Occupational History  . Not on file  Tobacco Use  . Smoking status: Current Every Day Smoker    Packs/day: 0.50    Years: 20.00    Pack years: 10.00  . Smokeless tobacco: Never Used  Vaping Use  . Vaping Use: Never used  Substance and Sexual Activity  . Alcohol use: No  . Drug  use: Yes    Types: Marijuana    Comment: LSD, Physlobin  . Sexual activity: Not on file  Other Topics Concern  . Not on file  Social History Narrative   Lives with wife and 2 kids.  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  Exercise with walking.  02/2020   Social Determinants of Health   Financial Resource Strain:   . Difficulty of Paying Living Expenses: Not on file  Food Insecurity:   . Worried About Charity fundraiser in the Last Year: Not on file  . Ran Out of Food in the Last Year: Not on file  Transportation Needs:   . Lack of Transportation (Medical): Not on file  . Lack of Transportation (Non-Medical): Not on file  Physical Activity:   . Days of Exercise per Week: Not on file  . Minutes of Exercise per Session: Not on file  Stress:   . Feeling of Stress : Not on file  Social Connections:   . Frequency of Communication with Friends and Family: Not on file  . Frequency of Social Gatherings with Friends and Family: Not on file  . Attends Religious Services: Not on file  . Active Member of Clubs or Organizations: Not on file  . Attends Archivist Meetings: Not on file  . Marital Status: Not on file   Outpatient Medications Prior to Visit    Medication Sig  . sertraline (ZOLOFT) 100 MG tablet Take 100 mg by mouth daily.  . hydrocortisone (ANUSOL-HC) 25 MG suppository Place 1 suppository (25 mg total) rectally every 12 (twelve) hours. (Patient not taking: Reported on 03/06/2020)  . [DISCONTINUED] aspirin-acetaminophen-caffeine (EXCEDRIN MIGRAINE) 250-250-65 MG per tablet Take 1 tablet by mouth every 6 (six) hours as needed for migraine. (Patient not taking: Reported on 03/06/2020)  . [DISCONTINUED] dicyclomine (BENTYL) 10 MG capsule Take 10 mg by mouth daily as needed for spasms. (Patient not taking: Reported on 03/06/2020)  . [DISCONTINUED] HYDROcodone-acetaminophen (NORCO/VICODIN) 5-325 MG per tablet Take 1 tablet by mouth 3 (three) times daily as needed.  . [DISCONTINUED] predniSONE (DELTASONE) 50 MG tablet Take 1 tablet (50 mg total) by mouth as directed. (Patient not taking: Reported on 03/06/2020)   No facility-administered medications prior to visit.   Allergies  Allergen Reactions  . Bee Venom Anaphylaxis  . Iodine Anaphylaxis    Immunization History  Administered Date(s) Administered  . Janssen (J&J) SARS-COV-2 Vaccination 08/30/2019    Health Maintenance  Topic Date Due  . Hepatitis C Screening  Never done  . HIV Screening  Never done  . TETANUS/TDAP  Never done  . INFLUENZA VACCINE  Never done  . COLONOSCOPY  07/30/2025  . COVID-19 Vaccine  Completed    Patient Care Team: Tysinger, Camelia Eng, PA-C as PCP - General (Family Medicine)  Review of Systems Review of Systems Constitutional: -fever, -chills, -sweats, -unexpected weight change, -anorexia, -fatigue Allergy: -sneezing, -itching, -congestion Dermatology: denies changing moles, rash, lumps, new worrisome lesions ENT: -runny nose, -ear pain, -sore throat, -hoarseness, -sinus pain, -teeth pain, -tinnitus, -hearing loss, -epistaxis Cardiology:  -chest pain, -palpitations, -edema, -orthopnea, -paroxysmal nocturnal dyspnea Respiratory: -cough, -shortness  of breath, -dyspnea on exertion, -wheezing, -hemoptysis Gastroenterology: -abdominal pain, -nausea, -vomiting, -diarrhea, -constipation, -blood in stool, -changes in bowel movement, -dysphagia Hematology: -bleeding or bruising problems Musculoskeletal: -arthralgias, -myalgias, -joint swelling,  +back pain, -neck pain, -cramping, -gait changes Ophthalmology: -vision changes, -eye redness, -itching, -discharge Urology: -dysuria, -difficulty urinating, -hematuria, -urinary frequency, -urgency, incontinence Neurology: -headache, -weakness, -tingling, -numbness, -speech  abnormality, -memory loss, -falls, -dizziness Psychology:  -depressed mood, -agitation, -sleep problems     Objective    BP 126/76   Pulse 87   Ht 5' 11"  (1.803 m)   Wt 151 lb 6.4 oz (68.7 kg)   SpO2 94%   BMI 21.12 kg/m  Physical Exam   General appearence: alert, no distress, WD/WN, white male HEENT: normocephalic, sclerae anicteric, PERRLA, EOMi, nares patent, no discharge or erythema, pharynx normal Oral cavity: MMM, no lesions Neck: supple, no lymphadenopathy, no thyromegaly, no masses, no bruits Heart: RRR, normal S1, S2, no murmurs Lungs: CTA bilaterally, no wheezes, rhonchi, or rales Abdomen: +bs, soft, non tender, non distended, no masses, no hepatomegaly, no splenomegaly Back: Tattoos at the upper back and lower back, fancy writing, tattoos several places on the anterior torso, tender in right mid to lower back midline and right-sided, otherwise nontender, mild pain with range of motion which is relatively full Musculoskeletal: nontender, no swelling, no obvious deformity Extremities: no edema, no cyanosis, no clubbing Pulses: 2+ symmetric, upper and lower extremities, normal cap refill Neurological: alert, oriented x 3, CN2-12 intact, strength normal upper extremities and lower extremities, sensation normal throughout, DTRs 2+ throughout, no cerebellar signs, gait normal Psychiatric: normal affect, behavior  normal, pleasant  GU: Normal male, circumcised, no mass no hernia no lymphadenopathy   Depression Screen PHQ 2/9 Scores 03/06/2020  PHQ - 2 Score 0  PHQ- 9 Score 0    Assessment & Plan   Encounter Diagnoses  Name Primary?  . Encounter for health maintenance examination in adult Yes  . Leg numbness   . Chronic low back pain, unspecified back pain laterality, unspecified whether sciatica present   . Smoker   . Depression, major, in remission ([place])   . Screening for lipid disorders   . Vaccine counseling        Physical exam - discussed and counseled on healthy lifestyle, diet, exercise, preventative care, vaccinations, sick and well care, proper use of emergency dept and after hours care, and addressed their concerns.    Health screening: See your eye doctor yearly for routine vision care. See your dentist yearly for routine dental care including hygiene visits twice yearly.  Cancer screening Advised monthly self testicular exam   Vaccinations: Advised yearly influenza vaccine Advise Covid and tetanus vaccines.  He declines today   Acute issues discussed: none  Separate significant chronic issues discussed: Back pain and leg numbness-go for baseline x-rays as discussed  Kendric was seen today for new patient (initial visit) and annual exam.  Diagnoses and all orders for this visit:  Encounter for health maintenance examination in adult -     Comprehensive metabolic panel -     CBC with Differential/Platelet -     Lipid panel -     TSH -     Vitamin B12 -     DG Lumbar Spine Complete; Future -     DG Thoracic Spine W/Swimmers; Future  Leg numbness -     DG Lumbar Spine Complete; Future -     DG Thoracic Spine W/Swimmers; Future  Chronic low back pain, unspecified back pain laterality, unspecified whether sciatica present -     DG Lumbar Spine Complete; Future -     DG Thoracic Spine W/Swimmers; Future  Smoker  Depression, major, in remission  ([place])  Screening for lipid disorders  Vaccine counseling    Follow-up pending labs, yearly for physical

## 2020-03-06 NOTE — Patient Instructions (Signed)
Please go to [facility] for your back xray.   Their hours are 8am - 4:30 pm Monday - Friday.  Take your insurance card with you.  [facility] [phone]  [address], [place], [place] [address]  [address], [place] [address]    Preventative Care for Adults - Male    Thank you for coming in for your well visit today, and thank you for trusting [place] with your care!  If you had a good experience today, please complete the surveys sent by [facility] and consider a review online such as Google or Health Grades, refer [place] to a friend   Maintain regular health and wellness exams:  A routine yearly physical is a good way to check in with your primary care provider about your health and preventive screening. It is also an opportunity to share updates about your health and any concerns you have, and receive a thorough all-over exam.   Most health insurance companies pay for at least some preventative services.  Check with your health plan for specific coverages.  What preventative services do men need?  Adult men should have their weight and blood pressure checked regularly.   Men age 31 and older should have their cholesterol levels checked regularly.  Beginning at age 56 and continuing to age 74, men should be screened for colorectal cancer.  Certain people may need continued testing until age 36.  Updating vaccinations is part of preventative care.  Vaccinations help protect against diseases such as the flu.  Osteoporosis is a disease in which the bones lose minerals and strength as we age. Men ages 22 and over should discuss this with their caregivers  Lab tests are generally done as part of preventative care to screen for anemia and blood disorders, to screen for problems with the kidneys and liver, to screen for bladder problems, to check blood sugar, and to check your cholesterol level.  Preventative services generally include counseling  about diet, exercise, avoiding tobacco, drugs, excessive alcohol consumption, and sexually transmitted infections.   Xrays and CT scans are not normally done as a preventative test, and most insurances do not pay for imaging for screening other than as discussed under cancer screens below.   On the other hand, if you have certain medical concerns, imaging may be necessary as a diagnostic test.    Your Medical Team Your medical team starts with [place], your PCP or primary care provider.  Please use our services for your routine care such as physicals, screenings, immunizations, sick visits, and your first stop for general medical concerns.  You can call our number for after hours information for urgent questions that may need attention but cannot wait til the next business day.    Urgent care-urgent cares exist to provide care when your primary care office would typically be closed such as evenings or weekends.   Urgent care is for evaluation of urgent medical problems that do not necessarily require emergency department care, but cannot wait til the next business day when we are open.  Emergency department care-please reserve emergency department care for serious, urgent, possibly life-threatening medical problems.  This includes issues like possible stroke, heart attack, significant injury, mental health crisis, or other urgent need that requires immediate medical attention.     See your dentist office twice yearly for hygiene and cleaning visits.   Brush your teeth and floss your teeth daily.  See your eye doctor yearly for routine  eye exam and screenings for glaucoma and retinal disease.   Vaccines:  Stay up to date with your tetanus shots and other required immunizations. You should have a booster for tetanus every 10 years. Be sure to get your flu shot every year, since 5%-20% of the [place] population comes down with the flu. The flu vaccine changes each year, so being vaccinated once is not  enough. Get your shot in the fall, before the flu season peaks.   Other vaccines to consider:  Pneumococcal vaccine to protect against certain types of pneumonia.  This is normally recommended for adults age 38 or older.  However, adults younger than 41 years old with certain underlying conditions such as diabetes, heart or lung disease should also receive the vaccine.  Shingles vaccine to protect against Varicella Zoster if you are older than age 24, or younger than 41 years old with certain underlying illness.  If you have not had the Shingrix vaccine, please call your insurer to inquire about coverage for the Shingrix vaccine given in 2 doses.   Some insurers cover this vaccine after age 4, some cover this after age 50.  If your insurer covers this, then call to schedule appointment to have this vaccine here  Hepatitis A vaccine to protect against a form of infection of the liver by a virus acquired from food.  Hepatitis B vaccine to protect against a form of infection of the liver by a virus acquired from blood or body fluids, particularly if you work in health care.  If you plan to travel internationally, check with your local health department for specific vaccination recommendations.  Human Papilloma Virus or HPV causes cancer of the cervix, and other infections that can be transmitted from person to person. There is a vaccine for HPV, and males should get immunized between the ages of 107 and 70. It requires a series of 3 shots.  Covid/Coronavirus - Please consider vaccination for your benefit and to help prevent spread of Covid to those around you.    What should I know about Cancer screening? Many types of cancers can be detected early and may often be prevented. Lung Cancer  You should be screened every year for lung cancer if: ? You are a current smoker who has smoked for at least 30 years. ? You are a former smoker who has quit within the past 15 years.  Talk to your health care  provider about your screening options, when you should start screening, and how often you should be screened.  Colorectal Cancer  Routine colorectal cancer screening usually begins at 41 years of age and should be repeated every 5-10 years until you are 41 years old. You may need to be screened more often if early forms of precancerous polyps or small growths are found. Your health care provider may recommend screening at an earlier age if you have risk factors for colon cancer.  Your health care provider may recommend using home test kits to check for hidden blood in the stool.  A small camera at the end of a tube can be used to examine your colon (sigmoidoscopy or colonoscopy). This checks for the earliest forms of colorectal cancer.  Prostate and Testicular Cancer  Depending on your age and overall health, your health care provider may do certain tests to screen for prostate and testicular cancer.  Talk to your health care provider about any symptoms or concerns you have about testicular or prostate cancer.  Skin Cancer  Check your skin from head to toe regularly.  Tell your health care provider about any new moles or changes in moles, especially if: ? There is a change in a mole's size, shape, or color. ? You have a mole that is larger than a pencil eraser.  Always use sunscreen. Apply sunscreen liberally and repeat throughout the day.  Protect yourself by wearing long sleeves, pants, a wide-brimmed hat, and sunglasses when outside.    GENERAL RECOMMENDATIONS FOR GOOD HEALTH:  Healthy diet:  Eat a variety of foods, including fruit, vegetables, animal or vegetable protein, such as meat, fish, chicken, and eggs, or beans, lentils, tofu, and grains, such as rice.  Drink plenty of water daily.  Decrease saturated fat in the diet, avoid lots of red meat, processed foods, sweets, fast foods, and fried foods.  Exercise:  Aerobic exercise helps maintain good heart health. At  least 30-40 minutes of moderate-intensity exercise is recommended. For example, a brisk walk that increases your heart rate and breathing. This should be done on most days of the week.   Find a type of exercise or a variety of exercises that you enjoy so that it becomes a part of your daily life.  Examples are running, walking, swimming, water aerobics, and biking.  For motivation and support, explore group exercise such as aerobic class, spin class, Zumba, Yoga,or  martial arts, etc.    Set exercise goals for yourself, such as a certain weight goal, walk or run in a race such as a 5k walk/run.  Speak to your primary care provider about exercise goals.  Your weight readings per our records: Wt Readings from Last 3 Encounters:  03/06/20 151 lb 6.4 oz (68.7 kg)  10/17/18 140 lb (63.5 kg)  10/16/18 140 lb (63.5 kg)    Body mass index is 21.12 kg/m.   Disease prevention:  If you smoke or chew tobacco, find out from your caregiver how to quit. It can literally save your life, no matter how long you have been a tobacco user. If you do not use tobacco, never begin.   Maintain a healthy diet and normal weight. Increased weight leads to problems with blood pressure and diabetes.   The Body Mass Index or BMI is a way of measuring how much of your body is fat. Having a BMI above 27 increases the risk of heart disease, diabetes, hypertension, stroke and other problems related to obesity. Your caregiver can help determine your BMI and based on it develop an exercise and dietary program to help you achieve or maintain this important measurement at a healthful level.  High blood pressure causes heart and blood vessel problems.  Persistent high blood pressure should be treated with medicine if weight loss and exercise do not work.  Your blood pressure readings per our records:     BP Readings from Last 3 Encounters:  03/06/20 126/76  07/31/18 (!) 95/44  07/20/18 90/60     Fat and cholesterol  leaves deposits in your arteries that can block them. This causes heart disease and vessel disease elsewhere in your body.  If your cholesterol is found to be high, or if you have heart disease or certain other medical conditions, then you may need to have your cholesterol monitored frequently and be treated with medication.   Ask if you should have a cardiac stress test if your history suggests this. A stress test is a test done on a treadmill that looks for heart disease. This test can  find disease prior to there being a problem.      Osteoporosis is a disease in which the bones lose minerals and strength as we age. This can result in serious bone fractures. Risk of osteoporosis can be identified using a bone density scan. Men ages 36 and over should discuss this with their caregivers. Ask your caregiver whether you should be taking a calcium supplement and Vitamin D, to reduce the rate of osteoporosis.   Avoid drinking alcohol in excess (more than two drinks per day).  Avoid use of street drugs. Do not share needles with anyone. Ask for professional help if you need assistance or instructions on stopping the use of alcohol, cigarettes, and/or drugs.  Brush your teeth twice a day with fluoride toothpaste, and floss once a day. Good oral hygiene prevents tooth decay and gum disease. The problems can be painful, unattractive, and can cause other health problems. Visit your dentist for a routine oral and dental check up and preventive care every 6-12 months.      Spiritual and Emotional Health Keeping a healthy spiritual life can help you better manage your physical health. Your spiritual life can help you to cope with any issues that may arise with your physical health.  Balance can keep [place] healthy and help [place] to recover.  If you are struggling with your spiritual health there are questions that you may want to ask yourself:  What makes me feel most complete? When do I feel most connected to the  rest of the world? Where do I find the most inner strength? What am I doing when I feel whole?  Helpful tips: . Being in nature. Some people feel very connected and at peace when they are walking outdoors or are outside. Marland Kitchen Helping others. Some feel the largest sense of wellbeing when they are of service to others. Being of service can take on many forms. It can be doing volunteer work, being kind to strangers, or offering a hand to a friend in need. . Gratitude. Some people find they feel the most connected when they remain grateful. They may make lists of all the things they are grateful for or say a thank you out loud for all they have.    Emotional Health Are you in tune with your emotional health?  Check out this link: http://www.bray.com/    Legal  Take the time to do a last will and testament, Advanced Directives including [place] and Living Will documents.  Don't leave your family with burdens that can be handled ahead of time.   Financial Health . Make sure you use a budget for your personal finances . Make sure you are insured against risks (health insurance, life insurance, auto insurance, etc) . Save more, spend less . Set financial goals . If you need help in this area, good resources include counseling through [employer] or other community resources, have a meeting with a Emergency planning/management officer, and a good resource is [employer] 10 reasons people come to the doctor's office:   (what is your "ounce of prevention")  Skin disorders; Osteoarthritis and joint disorders; Back problems; Cholesterol problems; Upper respiratory conditions, excluding asthma; Anxiety, depression, and bipolar disorder; Chronic neurologic disorders; High blood pressure; Headaches and migraines; and Diabetes.      Safety:  Use seatbelts 100% of the time, whether driving or as a passenger.  Use safety devices such as hearing  protection if you  work in environments with loud noise or significant background noise.  Use safety glasses when doing any work that could send debris in to the eyes.  Use a helmet if you ride a bike or motorcycle.  Use appropriate safety gear for contact sports.  Talk to your caregiver about gun safety.  Use sunscreen with a SPF (or skin protection factor) of 15 or greater.  Lighter skinned people are at a greater risk of skin cancer. Don't forget to also wear sunglasses in order to protect your eyes from too much damaging sunlight. Damaging sunlight can accelerate cataract formation.   Keep carbon monoxide and smoke detectors in your home functioning at all times. Change the batteries every 6 months or use a model that plugs into the wall.    Sexual activity: . Sex is a normal part of life and sexual activity can continue into older adulthood for many healthy people.   . If you are having erectile dysfunction issues, please follow up to discuss this further.   . If you are not in a monogamous relationship or have more than one partner, please practice safe sex.  Use condoms. Condoms are used for birth control and to help reduce the spread of sexually transmitted infections (or STIs).  Some of the STIs are gonorrhea (the clap), chlamydia, syphilis, trichomonas, herpes, HPV (human papilloma virus) and HIV (human immunodeficiency virus) which causes AIDS. The herpes, HIV and HPV are viral illnesses that have no cure. These can result in disability, cancer and death.   We are able to test for STIs here at our office.

## 2020-03-07 LAB — CBC WITH DIFFERENTIAL/PLATELET
Basophils Absolute: 0.1 10*3/uL (ref 0.0–0.2)
Basos: 1 %
EOS (ABSOLUTE): 0.3 10*3/uL (ref 0.0–0.4)
Eos: 4 %
Hematocrit: 43.4 % (ref 37.5–51.0)
Hemoglobin: 14.7 g/dL (ref 13.0–17.7)
Immature Grans (Abs): 0 10*3/uL (ref 0.0–0.1)
Immature Granulocytes: 0 %
Lymphocytes Absolute: 3.9 10*3/uL — ABNORMAL HIGH (ref 0.7–3.1)
Lymphs: 54 %
MCH: 30.1 pg (ref 26.6–33.0)
MCHC: 33.9 g/dL (ref 31.5–35.7)
MCV: 89 fL (ref 79–97)
Monocytes Absolute: 0.5 10*3/uL (ref 0.1–0.9)
Monocytes: 7 %
Neutrophils Absolute: 2.4 10*3/uL (ref 1.4–7.0)
Neutrophils: 34 %
Platelets: 285 10*3/uL (ref 150–450)
RBC: 4.88 x10E6/uL (ref 4.14–5.80)
RDW: 13 % (ref 11.6–15.4)
WBC: 7.1 10*3/uL (ref 3.4–10.8)

## 2020-03-07 LAB — COMPREHENSIVE METABOLIC PANEL
ALT: 10 IU/L (ref 0–44)
AST: 15 IU/L (ref 0–40)
Albumin/Globulin Ratio: 2.3 — ABNORMAL HIGH (ref 1.2–2.2)
Albumin: 5 g/dL (ref 4.0–5.0)
Alkaline Phosphatase: 60 IU/L (ref 44–121)
BUN/Creatinine Ratio: 9 (ref 9–20)
BUN: 9 mg/dL (ref 6–24)
Bilirubin Total: 0.4 mg/dL (ref 0.0–1.2)
CO2: 23 mmol/L (ref 20–29)
Calcium: 9.5 mg/dL (ref 8.7–10.2)
Chloride: 104 mmol/L (ref 96–106)
Creatinine, Ser: 0.97 mg/dL (ref 0.76–1.27)
GFR calc Af Amer: 112 mL/min/{1.73_m2} (ref >59)
GFR calc non Af Amer: 97 mL/min/{1.73_m2} (ref >59)
Globulin, Total: 2.2 g/dL (ref 1.5–4.5)
Glucose: 93 mg/dL (ref 65–99)
Potassium: 4.7 mmol/L (ref 3.5–5.2)
Sodium: 141 mmol/L (ref 134–144)
Total Protein: 7.2 g/dL (ref 6.0–8.5)

## 2020-03-07 LAB — LIPID PANEL
Chol/HDL Ratio: 6.7 ratio — ABNORMAL HIGH (ref 0.0–5.0)
Cholesterol, Total: 221 mg/dL — ABNORMAL HIGH (ref 100–199)
HDL: 33 mg/dL — ABNORMAL LOW (ref >39)
LDL Chol Calc (NIH): 169 mg/dL — ABNORMAL HIGH (ref 0–99)
Triglycerides: 107 mg/dL (ref 0–149)
VLDL Cholesterol Cal: 19 mg/dL (ref 5–40)

## 2020-03-07 LAB — TSH: TSH: 2.34 u[IU]/mL (ref 0.450–4.500)

## 2020-03-07 LAB — VITAMIN B12: Vitamin B-12: 378 pg/mL (ref 232–1245)

## 2020-03-10 ENCOUNTER — Ambulatory Visit
Admission: RE | Admit: 2020-03-10 | Discharge: 2020-03-10 | Disposition: A | Discharge status: Home / Self Care | Payer: 59 | Source: Ambulatory Visit | Attending: Medical | Admitting: Medical

## 2020-03-10 DIAGNOSIS — R200 Anesthesia of skin: Secondary | ICD-10-CM

## 2020-03-10 DIAGNOSIS — M545 Low back pain, unspecified: Secondary | ICD-10-CM

## 2020-03-10 DIAGNOSIS — G8929 Other chronic pain: Secondary | ICD-10-CM

## 2020-03-10 DIAGNOSIS — Z0000 Encounter for general adult medical examination without abnormal findings: Secondary | ICD-10-CM

## 2020-03-13 ENCOUNTER — Other Ambulatory Visit: Payer: Self-pay

## 2020-03-13 DIAGNOSIS — G8929 Other chronic pain: Secondary | ICD-10-CM

## 2020-03-13 DIAGNOSIS — M545 Low back pain, unspecified: Secondary | ICD-10-CM

## 2020-04-09 ENCOUNTER — Ambulatory Visit: Payer: 59 | Attending: Medical | Admitting: Physical Therapy

## 2020-05-01 ENCOUNTER — Telehealth: Payer: Self-pay | Admitting: Medical

## 2020-05-01 NOTE — Telephone Encounter (Signed)
See email.   Offer appt

## 2020-05-01 NOTE — Telephone Encounter (Signed)
Unable to reach pt by phone.

## 2020-05-20 ENCOUNTER — Other Ambulatory Visit (INDEPENDENT_AMBULATORY_CARE_PROVIDER_SITE_OTHER): Payer: 59

## 2020-05-20 ENCOUNTER — Encounter: Payer: Self-pay | Admitting: Medical

## 2020-05-20 ENCOUNTER — Telehealth (INDEPENDENT_AMBULATORY_CARE_PROVIDER_SITE_OTHER): Payer: 59 | Admitting: Medical

## 2020-05-20 VITALS — Wt 152.0 lb

## 2020-05-20 DIAGNOSIS — Z20822 Contact with and (suspected) exposure to covid-19: Secondary | ICD-10-CM | POA: Diagnosis not present

## 2020-05-20 DIAGNOSIS — R0981 Nasal congestion: Secondary | ICD-10-CM

## 2020-05-20 DIAGNOSIS — R519 Headache, unspecified: Secondary | ICD-10-CM | POA: Diagnosis not present

## 2020-05-20 LAB — POC COVID19 BINAXNOW: SARS Coronavirus 2 Ag: NEGATIVE

## 2020-05-20 NOTE — Progress Notes (Signed)
Subjective:     Patient ID: Brian Hess, male   DOB: 04-Nov-1980, 39 y.o.   MRN: [id number]  This visit type was conducted due to national recommendations for restrictions regarding the COVID-19 Pandemic (e.g. social distancing) in an effort to limit this patient's exposure and mitigate transmission in our community.  Due to their co-morbid illnesses, this patient is at least at moderate risk for complications without adequate follow up.  This format is felt to be most appropriate for this patient at this time.    Documentation for virtual audio and video telecommunications through [place] encounter:  The patient was located at home. The provider was located in the office. The patient did consent to this visit and is aware of possible charges through their insurance for this visit.  The other persons participating in this telemedicine service were none. Time spent on call was 20 minutes and in review of previous records 20 minutes total.  This virtual service is not related to other E/M service within previous 7 days.   HPI Chief Complaint  Patient presents with  . other    Exposure to covid head ache yesterday, sneezing, and stuffy nose, congestion, fatigued and tired   Went to sister in [place] for Christmas.   But found out that sister in law had an exposure to covid the week before.   Sister in law has had covid her self in the past.   He and wife have been congested the last few days.  Gets headaches in general, so initially didn't think about this being related to illness.   Has head congestion, sneezing.  Feels a little more fatigue and sleepy than usual.    No fever, no sore throat, no chills, no NVD, no cough.  Trying to quit smoking so has a litle smokers cough  He has had the covid vaccine.  No other aggravating or relieving factors. No other complaint.    Past Medical History:  Diagnosis Date  . Back pain   . Headache   . IBS (irritable bowel syndrome)   . Mood  change 02/2020   doing fine on Setraline, but in years prior several prior medications   ROS as in subjective  Review of Systems As in subjective    Objective:   Physical Exam Due to coronavirus pandemic stay at home measures, patient visit was virtual and they were not examined in person.   Wt 152 lb (68.9 kg)   BMI 21.20 kg/m       Assessment:     Encounter Diagnoses  Name Primary?  . Head congestion Yes  . Exposure to COVID-19 virus   . Nonintractable headache, unspecified chronicity pattern, unspecified headache type        Plan:     He does not have a full set of symptoms just and head congestion.  There was a potential exposure through someone without symptoms that had an exposure the week before  He is vaccinated.  He will come in today for Covid screening as a precaution  We discussed that if he becomes positive or if his symptoms develop into more of a respiratory infection to use supportive measures as discussed and to call back if much worse in the coming days  Vasiliy was seen today for other.  Diagnoses and all orders for this visit:  Head congestion -     POC COVID-19 BinaxNow; Future -     Novel Coronavirus, NAA ([facility]); Future  Exposure to COVID-19 virus -  POC COVID-19 BinaxNow; Future -     Novel Coronavirus, NAA ([facility]); Future  Nonintractable headache, unspecified chronicity pattern, unspecified headache type -     POC COVID-19 BinaxNow; Future -     Novel Coronavirus, NAA ([facility]); Future  f/u today in back parking lot for testing

## 2020-05-22 LAB — NOVEL CORONAVIRUS, NAA: SARS-CoV-2, NAA: NOT DETECTED

## 2020-05-22 LAB — SARS-COV-2, NAA 2 DAY TAT

## 2020-05-29 ENCOUNTER — Other Ambulatory Visit: Payer: Self-pay

## 2020-05-29 ENCOUNTER — Other Ambulatory Visit (INDEPENDENT_AMBULATORY_CARE_PROVIDER_SITE_OTHER): Payer: BC Managed Care – PPO

## 2020-05-29 ENCOUNTER — Encounter: Payer: Self-pay | Admitting: Medical

## 2020-05-29 ENCOUNTER — Other Ambulatory Visit: Payer: Self-pay | Admitting: Medical

## 2020-05-29 ENCOUNTER — Telehealth (INDEPENDENT_AMBULATORY_CARE_PROVIDER_SITE_OTHER): Payer: 59 | Admitting: Medical

## 2020-05-29 VITALS — Temp 102.3°F | Ht 70.0 in | Wt 155.0 lb

## 2020-05-29 DIAGNOSIS — R52 Pain, unspecified: Secondary | ICD-10-CM

## 2020-05-29 DIAGNOSIS — Z20822 Contact with and (suspected) exposure to covid-19: Secondary | ICD-10-CM

## 2020-05-29 DIAGNOSIS — R059 Cough, unspecified: Secondary | ICD-10-CM

## 2020-05-29 LAB — POC COVID19 BINAXNOW: SARS Coronavirus 2 Ag: POSITIVE — AB

## 2020-05-29 MED ORDER — EMERGEN-C IMMUNE PLUS PO PACK: 1.0000 | PACK | Freq: Two times a day (BID) | ORAL | 0 refills | Status: DC

## 2020-05-29 MED ORDER — HYDROCOD POLST-CPM POLST ER 10-8 MG/5ML PO SUER: 5.0000 mL | Freq: Two times a day (BID) | ORAL | 0 refills | Status: DC

## 2020-05-29 MED ORDER — ALBUTEROL SULFATE HFA 108 (90 BASE) MCG/ACT IN AERS: 2.0000 | INHALATION_SPRAY | Freq: Four times a day (QID) | RESPIRATORY_TRACT | 0 refills | Status: DC | PRN

## 2020-05-29 NOTE — Progress Notes (Signed)
Subjective:     Patient ID: Brian Hess, male   DOB: 08-22-1980, 40 y.o.   MRN: [id number]  This visit type was conducted due to national recommendations for restrictions regarding the COVID-19 Pandemic (e.g. social distancing) in an effort to limit this patient's exposure and mitigate transmission in our community.  Due to their co-morbid illnesses, this patient is at least at moderate risk for complications without adequate follow up.  This format is felt to be most appropriate for this patient at this time.    Documentation for virtual audio and video telecommunications through [facility] encounter:  The patient was located at home. The provider was located in the office. The patient did consent to this visit and is aware of possible charges through their insurance for this visit.  The other persons participating in this telemedicine service were none. Time spent on call was 20 minutes and in review of previous records 20 minutes total.  This virtual service is not related to other E/M service within previous 7 days.   HPI Chief Complaint  Patient presents with  . Covid Exposure    Symptoms started night of 05/29/20. Fever, joint pain, muscle pain, cough and fatigue    Virtual consult for illness.  He was seen here for covid testing on 05/20/20 due to possible covid exposure but was negative and his minimal congestion resolved.    However he has new symptoms that began yesterday and learned of 2 recent covid contacts.   He found out his aunt is positive and in the hospital with covid. He was around her last week and a friend of his also positive.   He notes 1 day of horrible body aches, chills, fever, bundled up in the bed, change in taste, some couhg, headache.  No NVD, no sore throat, no other c/o.   Using some Excedrin.  Feels some dyspnea.  No other aggravating or relieving factors. No other complaint.    Past Medical History:  Diagnosis Date  . Back pain   . Headache   .  IBS (irritable bowel syndrome)   . Mood change 02/2020   doing fine on Setraline, but in years prior several prior medications     Review of Systems As in subjective    Objective:   Physical Exam Due to coronavirus pandemic stay at home measures, patient visit was virtual and they were not examined in person.   Temp (!) 102.3 F (39.1 C)   Ht 5' 10"  (1.778 m)   Wt 155 lb (70.3 kg)   BMI 22.24 kg/m  Wt Readings from Last 3 Encounters:  05/29/20 155 lb (70.3 kg)  05/20/20 152 lb (68.9 kg)  03/06/20 151 lb 6.4 oz (68.7 kg)   Gen: wd, wn, nad, ill appearing No obvious wheezing or dyspnea He sounds congested Answering questions in complete sentences     Assessment:     Encounter Diagnoses  Name Primary?  . Close exposure to COVID-19 virus Yes  . Cough   . Body aches        Plan:     discussed new symptoms, exposures, likelihood of him being positive as well as wife who is also coming today for covid testing.    General recommendations: I recommend you rest, hydrate well with water and clear fluids throughout the day.   You can use Tylenol for pain or fever  I sent albuterol in the event his dyspnea worsens, sent tussionex for aches and cough.   You  can use over the counter Emetrol for nausea.    Consider EmergenC Immune plus vitamin pack over the counter  If you are having trouble breathing, if you are very weak, have high fever 103 or higher consistently despite Tylenol, or uncontrollable nausea and vomiting, then call or go to the emergency department.    Covid symptoms such as fatigue and cough can linger over 2 weeks, even after the initial fever, aches, chills, and other initial symptoms.  If worse or not improving in the next few days, particular with breathing then call back.     Self Quarantine: The CDC, Centers for Disease Control has recommended a self quarantine of 7-10 days from the start of your illness until you are symptom-free including at least  24 hours of no symptoms including no fever, no shortness of breath, and no body aches and chills, by day 7-10 before returning to work or general contact with the public.  What does self quarantine mean: avoiding contact with people as much as possible.   Particularly in your house, isolate your self from others in a separate room, wear a mask when possible in the room, particularly if coughing a lot.   Have others bring food, water, medications, etc., to your door, but avoid direct contact with your household contacts during this time to avoid spreading the infection to them.   If you have a separate bathroom and living quarters during the next 2 weeks away from others, that would be preferable.    If you can't completely isolate, then wear a mask, wash hands frequently with soap and water for at least 15 seconds, minimize close contact with others, and have a friend or family member check regularly from a distance to make sure you are not getting seriously worse.     You should not be going out in public, should not be going to stores, to work or other public places until all your symptoms have resolved and at least 7 days + 24 hours of no symptoms at all have transpired.   Ideally you should avoid contact with others for a full 7 days if possible.  One of the goals is to limit spread to high risk people; people that are older and elderly, people with multiple health issues like diabetes, heart disease, lung disease, and anybody that has weakened immune systems such as people with cancer or on immunosuppressive therapy.    Deacon was seen today for covid exposure.  Diagnoses and all orders for this visit:  Close exposure to COVID-19 virus -     POC COVID-19 BinaxNow; Future -     Novel Coronavirus, NAA ([facility]); Future  Cough -     POC COVID-19 BinaxNow; Future -     Novel Coronavirus, NAA ([facility]); Future  Body aches -     POC COVID-19 BinaxNow; Future -     Novel Coronavirus, NAA ([facility]);  Future  Other orders -     Multiple Vitamins-Minerals (EMERGEN-C IMMUNE PLUS) PACK; Take 1 tablet by mouth 2 (two) times daily. -     albuterol (VENTOLIN HFA) 108 (90 Base) MCG/ACT inhaler; Inhale 2 puffs into the lungs every 6 (six) hours as needed for wheezing or shortness of breath. -     chlorpheniramine-HYDROcodone (TUSSIONEX PENNKINETIC ER) 10-8 MG/5ML SUER; Take 5 mLs by mouth 2 (two) times daily.  f/u today for covid testing in our back parking lot.

## 2020-06-16 ENCOUNTER — Emergency Department (HOSPITAL_COMMUNITY): Payer: BC Managed Care – PPO

## 2020-06-16 ENCOUNTER — Emergency Department (HOSPITAL_COMMUNITY)
Admission: EM | Admit: 2020-06-16 | Discharge: 2020-06-16 | Disposition: A | Discharge status: Home / Self Care | Payer: BC Managed Care – PPO | Attending: Emergency Medicine | Admitting: Emergency Medicine

## 2020-06-16 DIAGNOSIS — R569 Unspecified convulsions: Secondary | ICD-10-CM

## 2020-06-16 DIAGNOSIS — S199XXA Unspecified injury of neck, initial encounter: Secondary | ICD-10-CM | POA: Diagnosis not present

## 2020-06-16 DIAGNOSIS — R531 Weakness: Secondary | ICD-10-CM | POA: Diagnosis not present

## 2020-06-16 DIAGNOSIS — W19XXXA Unspecified fall, initial encounter: Secondary | ICD-10-CM | POA: Diagnosis not present

## 2020-06-16 DIAGNOSIS — M542 Cervicalgia: Secondary | ICD-10-CM | POA: Diagnosis not present

## 2020-06-16 DIAGNOSIS — F17200 Nicotine dependence, unspecified, uncomplicated: Secondary | ICD-10-CM | POA: Insufficient documentation

## 2020-06-16 DIAGNOSIS — R55 Syncope and collapse: Secondary | ICD-10-CM | POA: Diagnosis not present

## 2020-06-16 DIAGNOSIS — M50222 Other cervical disc displacement at C5-C6 level: Secondary | ICD-10-CM | POA: Diagnosis not present

## 2020-06-16 DIAGNOSIS — S0003XA Contusion of scalp, initial encounter: Secondary | ICD-10-CM | POA: Diagnosis not present

## 2020-06-16 DIAGNOSIS — S0001XA Abrasion of scalp, initial encounter: Secondary | ICD-10-CM | POA: Diagnosis not present

## 2020-06-16 DIAGNOSIS — S0990XA Unspecified injury of head, initial encounter: Secondary | ICD-10-CM | POA: Diagnosis not present

## 2020-06-16 DIAGNOSIS — W228XXA Striking against or struck by other objects, initial encounter: Secondary | ICD-10-CM | POA: Insufficient documentation

## 2020-06-16 DIAGNOSIS — R410 Disorientation, unspecified: Secondary | ICD-10-CM | POA: Diagnosis not present

## 2020-06-16 LAB — URINALYSIS, ROUTINE W REFLEX MICROSCOPIC
Bilirubin Urine: NEGATIVE
Glucose, UA: NEGATIVE mg/dL
Hgb urine dipstick: NEGATIVE
Ketones, ur: NEGATIVE mg/dL
Leukocytes,Ua: NEGATIVE
Nitrite: NEGATIVE
Protein, ur: NEGATIVE mg/dL
Specific Gravity, Urine: 1.01 (ref 1.005–1.030)
pH: 6 (ref 5.0–8.0)

## 2020-06-16 LAB — CBC WITH DIFFERENTIAL/PLATELET
Abs Immature Granulocytes: 0.04 10*3/uL (ref 0.00–0.07)
Basophils Absolute: 0.1 10*3/uL (ref 0.0–0.1)
Basophils Relative: 1 %
Eosinophils Absolute: 0.2 10*3/uL (ref 0.0–0.5)
Eosinophils Relative: 2 %
HCT: 42 % (ref 39.0–52.0)
Hemoglobin: 13.3 g/dL (ref 13.0–17.0)
Immature Granulocytes: 0 %
Lymphocytes Relative: 37 %
Lymphs Abs: 3.7 10*3/uL (ref 0.7–4.0)
MCH: 29.3 pg (ref 26.0–34.0)
MCHC: 31.7 g/dL (ref 30.0–36.0)
MCV: 92.5 fL (ref 80.0–100.0)
Monocytes Absolute: 0.7 10*3/uL (ref 0.1–1.0)
Monocytes Relative: 7 %
Neutro Abs: 5.4 10*3/uL (ref 1.7–7.7)
Neutrophils Relative %: 53 %
Platelets: 323 10*3/uL (ref 150–400)
RBC: 4.54 MIL/uL (ref 4.22–5.81)
RDW: 13.4 % (ref 11.5–15.5)
WBC: 10 10*3/uL (ref 4.0–10.5)
nRBC: 0 % (ref 0.0–0.2)

## 2020-06-16 LAB — MAGNESIUM: Magnesium: 1.8 mg/dL (ref 1.7–2.4)

## 2020-06-16 LAB — COMPREHENSIVE METABOLIC PANEL
ALT: 16 U/L (ref 0–44)
AST: 22 U/L (ref 15–41)
Albumin: 4.2 g/dL (ref 3.5–5.0)
Alkaline Phosphatase: 43 U/L (ref 38–126)
Anion gap: 14 (ref 5–15)
BUN: 8 mg/dL (ref 6–20)
CO2: 20 mmol/L — ABNORMAL LOW (ref 22–32)
Calcium: 8.8 mg/dL — ABNORMAL LOW (ref 8.9–10.3)
Chloride: 105 mmol/L (ref 98–111)
Creatinine, Ser: 1.05 mg/dL (ref 0.61–1.24)
GFR, Estimated: >60 mL/min (ref >60)
Glucose, Bld: 122 mg/dL — ABNORMAL HIGH (ref 70–99)
Potassium: 3.4 mmol/L — ABNORMAL LOW (ref 3.5–5.1)
Sodium: 139 mmol/L (ref 135–145)
Total Bilirubin: 0.6 mg/dL (ref 0.3–1.2)
Total Protein: 6.5 g/dL (ref 6.5–8.1)

## 2020-06-16 LAB — RAPID URINE DRUG SCREEN, HOSP PERFORMED
Amphetamines: NOT DETECTED
Barbiturates: NOT DETECTED
Benzodiazepines: NOT DETECTED
Cocaine: NOT DETECTED
Opiates: POSITIVE — AB
Tetrahydrocannabinol: POSITIVE — AB

## 2020-06-16 LAB — CBG MONITORING, ED: Glucose-Capillary: 124 mg/dL — ABNORMAL HIGH (ref 70–99)

## 2020-06-16 LAB — ETHANOL: Alcohol, Ethyl (B): <10 mg/dL (ref <10)

## 2020-06-16 LAB — LACTIC ACID, PLASMA: Lactic Acid, Venous: 1.3 mmol/L (ref 0.5–1.9)

## 2020-06-16 MED ORDER — HYDROMORPHONE HCL 1 MG/ML IJ SOLN
1.0000 mg | Freq: Once | INTRAMUSCULAR | Status: AC
  Administered 2020-06-16: 1 mg via INTRAVENOUS
  Filled 2020-06-16: qty 1

## 2020-06-16 MED ORDER — MORPHINE SULFATE (PF) 2 MG/ML IV SOLN
1.0000 mg | Freq: Once | INTRAVENOUS | Status: AC
  Administered 2020-06-16: 1 mg via INTRAVENOUS
  Filled 2020-06-16: qty 1

## 2020-06-16 MED ORDER — GADOBUTROL 1 MMOL/ML IV SOLN
7.0000 mL | Freq: Once | INTRAVENOUS | Status: AC | PRN
  Administered 2020-06-16: 7 mL via INTRAVENOUS

## 2020-06-16 MED ORDER — METHOCARBAMOL 500 MG PO TABS: 500.0000 mg | ORAL_TABLET | Freq: Three times a day (TID) | ORAL | 0 refills | Status: DC | PRN

## 2020-06-16 MED ORDER — IBUPROFEN 600 MG PO TABS: 600.0000 mg | ORAL_TABLET | Freq: Three times a day (TID) | ORAL | 0 refills | Status: DC | PRN

## 2020-06-16 MED ORDER — SODIUM CHLORIDE 0.9 % IV BOLUS
1000.0000 mL | Freq: Once | INTRAVENOUS | Status: AC
  Administered 2020-06-16: 1000 mL via INTRAVENOUS

## 2020-06-16 NOTE — ED Notes (Signed)
Patient transported to CT 

## 2020-06-16 NOTE — ED Notes (Signed)
Patient transported to MRI 
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
## 2020-06-16 NOTE — Discharge Instructions (Addendum)
1.  There was report of seizure-like activity and loss of consciousness. 2.  At this time your MRI scans do not show any evidence of aneurysms, tumors or other emergent conditions.  However, you will require further evaluation for possible seizure disorder. 3.  Do not drive a motor vehicle or do any activities that could result in injury if you had a seizure. 4.  Follow-up with your family doctor and also a neurologist for further evaluation.  A referral has been made to [facility].  Call them to schedule a follow-up appointment soon as possible. 5.  Avoid all drugs, alcohol and new medications until your evaluation is complete.

## 2020-06-16 NOTE — ED Notes (Signed)
Seizure pads placed on the bed at this time.

## 2020-06-16 NOTE — ED Triage Notes (Signed)
Per ems pt was in [employer] and fell back and hit the back of his head and started having  A witnessed Seizure. When ems arrived pt was post ictal. Pt has no hx of seizures. Pt has a lac to the back of his head. pt c/o headache and neck pain. c collar is in place. No blood thinners. Unsure of patients actual hx bc no family around. Pt currently axo x3

## 2020-06-18 ENCOUNTER — Ambulatory Visit (INDEPENDENT_AMBULATORY_CARE_PROVIDER_SITE_OTHER): Payer: 59 | Admitting: Neurology

## 2020-06-18 ENCOUNTER — Encounter: Payer: Self-pay | Admitting: Medical

## 2020-06-18 ENCOUNTER — Encounter: Payer: Self-pay | Admitting: Neurology

## 2020-06-18 ENCOUNTER — Other Ambulatory Visit: Payer: Self-pay

## 2020-06-18 ENCOUNTER — Telehealth (INDEPENDENT_AMBULATORY_CARE_PROVIDER_SITE_OTHER): Payer: BC Managed Care – PPO | Admitting: Medical

## 2020-06-18 VITALS — Ht 71.0 in | Wt 154.0 lb

## 2020-06-18 VITALS — BP 114/81 | HR 95 | Ht 71.0 in | Wt 152.2 lb

## 2020-06-18 DIAGNOSIS — R4020 Unspecified coma: Secondary | ICD-10-CM | POA: Diagnosis not present

## 2020-06-18 DIAGNOSIS — R55 Syncope and collapse: Secondary | ICD-10-CM

## 2020-06-18 DIAGNOSIS — Z79899 Other long term (current) drug therapy: Secondary | ICD-10-CM | POA: Diagnosis not present

## 2020-06-18 DIAGNOSIS — I959 Hypotension, unspecified: Secondary | ICD-10-CM | POA: Insufficient documentation

## 2020-06-18 DIAGNOSIS — R569 Unspecified convulsions: Secondary | ICD-10-CM

## 2020-06-18 HISTORY — DX: Syncope and collapse: R55

## 2020-06-18 HISTORY — DX: Hypotension, unspecified: I95.9

## 2020-06-18 HISTORY — DX: Other long term (current) drug therapy: Z79.899

## 2020-06-18 HISTORY — DX: Unspecified convulsions: R56.9

## 2020-06-18 NOTE — Progress Notes (Signed)
Done
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
## 2020-06-18 NOTE — Progress Notes (Signed)
Subjective:     Patient ID: Brian Hess, male   DOB: 1981-01-12, 40 y.o.   MRN: [id number]  This visit type was conducted due to national recommendations for restrictions regarding the COVID-19 Pandemic (e.g. social distancing) in an effort to limit this patient's exposure and mitigate transmission in our community.  Due to their co-morbid illnesses, this patient is at least at moderate risk for complications without adequate follow up.  This format is felt to be most appropriate for this patient at this time.    Documentation for virtual audio and video telecommunications through [place] encounter:  The patient was located at home. The provider was located in the office. The patient did consent to this visit and is aware of possible charges through their insurance for this visit.  The other persons participating in this telemedicine service were wife. Time spent on call was 20 minutes and in review of previous records 20 minutes total.  This virtual service is not related to other E/M service within previous 7 days.   HPI Chief Complaint  Patient presents with  . Follow-up    [employer] out in store 06/16/20.   Virtual consult for syncope.  Was at [employer] 2 days ago shopping for groceries.  Was feeling cross eyed and dizzy.  Started feeling funny.  Got up to the front of store, was checking out at counter, and the next thing he knew was waking up in the ambulance.  Workers at [employer], thought he may have had a seizure.  He was unconscious for probably several minutes from the time he fell out to the time EMS arrived.  There was no consensus on whether there was seizure activity or not but he apparently did not come to shortly after falling out.    He denies recent dehydration, and actually felt like he was doing pretty good job of hydration.  He had been taking some of the Tussionex cough syrup.  He apparently smokes marijuana as well.  He had an exposure to COVID earlier in the month  and had some respiratory symptoms prior to this episode.  See virtual consult notes from 05/29/2020.  Overall he had improved but was still having some residual cough so he was using the Tussionex some.  He denies any significant fever, body aches chills or fatigue or dehydration days before the syncope episode  He denies chest pain, palpitations, edema, numbness, tingling, weakness.  No significant heart disease in the family other than one grandparent that had a pacemaker  He denies other drug use  Taken to the hospital by EMS, was seen in [facility].   He had numerous scans, labs, EKGs.  When he fell he hit his shoulder so he has been having neck pain and shoulder pain.  He had neck scans in the hospital that did not show a fracture.  He was prescribed ibuprofen and Robaxin.  He cannot take ibuprofen due to history of IBS and bad upset stomach.  He has tried the Robaxin but does not seem to be helping at all.  He notes that Dilaudid and other medicines in the hospital did not help either.  He is asking for something else to help with pain and sleep  He also had an abrasion to the back of his left scalp that he says was not dealt with at the hospital.  He was sent home with a bloody mass.  Currently he just has an abrasion of the scalp without open wound  and no more bleeding since few days ago  He denies any recent confusion, slurred speech, and only this 1 episode of syncope  He has a neurology appointment this afternoon  Family History  Problem Relation Age of Onset  . Diabetes Mother   . Depression Mother   . Cancer Maternal Grandfather        not sure what king  . Diabetes Maternal Grandfather   . Depression Father   . Other Father        electrocution, brain damage and finger amputation  . Diabetes Sister   . Diabetes Maternal Aunt   . Heart disease Maternal Grandmother        pacemaker  . Cancer Paternal Grandmother        breast  . Heart disease Paternal Grandfather   .  Colon polyps Neg Hx   . Esophageal cancer Neg Hx   . Pancreatic cancer Neg Hx   . Rectal cancer Neg Hx   . Stomach cancer Neg Hx       Review of Systems As in subjective    Objective:   Physical Exam Due to coronavirus pandemic stay at home measures, patient visit was virtual and they were not examined in person.    Gen: Well-developed well-nourished no acute distress Eyes seem to have PERRLA EOMI, head normocephalic Psych: Speech normal, pleasant, answers questions appropriately, no obvious confusion, alert and oriented Extremities moving in normal motions without asymmetry or abnormality   BP Readings from Last 3 Encounters:  06/16/20 (!) 106/58  03/06/20 126/76  07/31/18 (!) 95/44   Wt Readings from Last 3 Encounters:  06/18/20 154 lb (69.9 kg)  05/29/20 155 lb (70.3 kg)  05/20/20 152 lb (68.9 kg)   I reviewed multiple imaging from 06/16/2020 emergency department visit including CT head, CT cervical spine, MRI brain, MR cervical spine, MR angio head, MRV head.  I reviewed EKG.  I reviewed labs including ethanol, magnesium, complex metabolic panel, CBC, rapid urine drug screen, urinalysis  There was some degenerative disc disease of the cervical spine noted, there was slightly low potassium, slightly elevated glucose.  Positive for opiates in THC in the drug screen.  Otherwise results were fairly unremarkable      Assessment:     Encounter Diagnoses  Name Primary?  . Syncope and collapse Yes  . Seizure-like activity ([place])   . High risk medication use   . Hypotension, unspecified hypotension type        Plan:     I reviewed his 06/16/2020 emergency department notes, EKG, CT and MRI of neck, CT and MRI of brain, MRV of head, numerous labs.  medicines reconciled  He has follow-up today with Dr. Leonie Man at [facility] neurology for evaluation.  I assume they will likely do an EEG  We discussed possibly doing cardiac monitoring to rule out arrhythmia  We discussed  potential differential and causes of his syncopal episode which could include seizure, medication adverse effect, substance abuse, dehydration and fatigue, panic attack, vasovagal syncope or other  His drug screen did show opiates and THC and he has been using Tussionex prescription cough syrup as well as marijuana which could have potentially triggered this reaction.  Advised to use no more of the Tussionex.  Advise he abstain from marijuana  We discussed the need to make sure he is hydrating well  I will await neurology notes today  There was no other obvious striking cause of symptoms on evaluation.  No obvious stroke,  no obvious intracranial bleed, vitals were within reason at the hospital.  Potassium was one tenth of a point low, glucose was slightly elevated  He will have his wife drive him to the appointment today at neurology.  We discussed safety and avoiding driving until we have ruled out seizure.  For now rest, hydrate, avoid Tussionex.  Regarding his neck pain and soreness advised to use Tylenol and continue the Robaxin little longer.  Once he sees neurology today we could potentially consider other medicines like gabapentin but I advised we avoid other opiates.  I do not feel comfortable giving him any other pain medicine or stronger muscle relaxer until he sees neurology  Referral for cardiac event monitor  [place] was seen today for follow-up.  Diagnoses and all orders for this visit:  Syncope and collapse  Seizure-like activity ([facility])  High risk medication use  Hypotension, unspecified hypotension type  f/u pending call back, neurology consult

## 2020-06-18 NOTE — Patient Instructions (Signed)
I had a long discussion with the patient and his wife regarding his episode of loss of consciousness and witnessed seizure like activity likely representing his first unprovoked seizure episode.  Discussed risk of seizure recurrence and risk benefit of starting anticonvulsants and answered questions.  I recommend we check EEG and we will hold off on anticonvulsants for now unless he has a second episode or has abnormal EEG.  Advised him to stay away from seizure provoking triggers like sleep deprivation, stimulants like alcohol, marijuana and street drugs as well as avoid extremes of exertions or  food fads.  Advised not to drive for 6 months as per [facility] and he voiced understanding.  He will return for follow-up with me in 3 months or call earlier if necessary.  Seizure, Adult A seizure is a sudden burst of abnormal electrical and chemical activity in the brain. Seizures usually last from 30 seconds to 2 minutes.  What are the causes? Common causes of this condition include:  Fever or infection.  Problems that affect the brain. These may include: ? A brain or head injury. ? Bleeding in the brain. ? A brain tumor.  Low levels of blood sugar or salt.  Kidney problems or liver problems.  Conditions that are passed from parent to child (are inherited).  Problems with a substance, such as: ? Having a reaction to a drug or a medicine. ? Stopping the use of a substance all of a sudden (withdrawal).  A stroke.  Disorders that affect how you develop. Sometimes, the cause may not be known.  What increases the risk?  Having someone in your family who has epilepsy. In this condition, seizures happen again and again over time. They have no clear cause.  Having had a tonic-clonic seizure before. This type of seizure causes you to: ? Tighten the muscles of the whole body. ? Lose consciousness.  Having had a head injury or strokes before.  Having had a lack of oxygen at  birth. What are the signs or symptoms? There are many types of seizures. The symptoms vary depending on the type of seizure you have. Symptoms during a seizure  Shaking that you cannot control (convulsions) with fast, jerky movements of muscles.  Stiffness of the body.  Breathing problems.  Feeling mixed up (confused).  Staring or not responding to sound or touch.  Head nodding.  Eyes that blink, flutter, or move fast.  Drooling, grunting, or making clicking sounds with your mouth  Losing control of when you pee or poop. Symptoms before a seizure  Feeling afraid, nervous, or worried.  Feeling like you may vomit.  Feeling like: ? You are moving when you are not. ? Things around you are moving when they are not.  Feeling like you saw or heard something before (dj vu).  Odd tastes or smells.  Changes in how you see. You may see flashing lights or spots. Symptoms after a seizure  Feeling confused.  Feeling sleepy.  Headache.  Sore muscles. How is this treated? If your seizure stops on its own, you will not need treatment. If your seizure lasts longer than 5 minutes, you will normally need treatment. Treatment may include:  Medicines given through an IV tube.  Avoiding things, such as medicines, that are known to cause your seizures.  Medicines to prevent seizures.  A device to prevent or control seizures.  Surgery.  A diet low in carbohydrates and high in fat (ketogenic diet). Follow these instructions at  home: Medicines  Take over-the-counter and prescription medicines only as told by your doctor.  Avoid foods or drinks that may keep your medicine from working, such as alcohol. Activity  Follow instructions about driving, swimming, or doing things that would be dangerous if you had another seizure. Wait until your doctor says it is safe for you to do these things.  If you live in the [place] ask your local department of motor vehicles when you can  drive.  Get a lot of rest. Teaching others  Teach friends and family what to do when you have a seizure. They should: ? Help you get down to the ground. ? Protect your head and body. ? Loosen any clothing around your neck. ? Turn you on your side. ? Know whether or not you need emergency care. ? Stay with you until you are better.  Also, tell them what not to do if you have a seizure. Tell them: ? They should not hold you down. ? They should not put anything in your mouth.   General instructions  Avoid anything that gives you seizures.  Keep a seizure diary. Write down: ? What you remember about each seizure. ? What you think caused each seizure.  Keep all follow-up visits. Contact a doctor if:  You have another seizure or seizures. Call the doctor each time you have a seizure.  The pattern of your seizures changes.  You keep having seizures with treatment.  You have symptoms of being sick or having an infection.  You are not able to take your medicine. Get help right away if:  You have any of these problems: ? A seizure that lasts longer than 5 minutes. ? Many seizures in a row and you do not feel better between seizures. ? A seizure that makes it harder to breathe. ? A seizure and you can no longer speak or use part of your body.  You do not wake up right after a seizure.  You get hurt during a seizure.  You feel confused or have pain right after a seizure. These symptoms may be an emergency. Get help right away. Call your local emergency services (911 in the [place]  Do not wait to see if the symptoms will go away.  Do not drive yourself to the hospital. Summary  A seizure is a sudden burst of abnormal electrical and chemical activity in the brain. Seizures normally last from 30 seconds to 2 minutes.  Causes of seizures include illness, injury to the head, low levels of blood sugar or salt, and certain conditions.  Most seizures will stop on their own in  less than 5 minutes. Seizures that last longer than 5 minutes are a medical emergency and need treatment right away.  Many medicines are used to treat seizures. Take over-the-counter and prescription medicines only as told by your doctor. This information is not intended to replace advice given to you by your health care provider. Make sure you discuss any questions you have with your health care provider. Document Revised: 11/15/2019 Document Reviewed: 11/15/2019 Elsevier Patient Education  [place].

## 2020-06-19 ENCOUNTER — Telehealth: Payer: Self-pay

## 2020-06-19 NOTE — Telephone Encounter (Signed)
30 day Event Monitor registered to be mailed to pt's home address.  Address verified and I went over brief instructions with the pt and sent instructions via MyChart.

## 2020-06-22 NOTE — Progress Notes (Signed)
I agree with the above plan 

## 2020-06-24 ENCOUNTER — Ambulatory Visit (INDEPENDENT_AMBULATORY_CARE_PROVIDER_SITE_OTHER): Payer: BC Managed Care – PPO

## 2020-06-24 DIAGNOSIS — R569 Unspecified convulsions: Secondary | ICD-10-CM

## 2020-06-24 DIAGNOSIS — R55 Syncope and collapse: Secondary | ICD-10-CM | POA: Diagnosis not present

## 2020-06-29 ENCOUNTER — Ambulatory Visit (INDEPENDENT_AMBULATORY_CARE_PROVIDER_SITE_OTHER): Payer: Self-pay | Admitting: Neurology

## 2020-06-29 DIAGNOSIS — R569 Unspecified convulsions: Secondary | ICD-10-CM

## 2020-06-29 DIAGNOSIS — R4020 Unspecified coma: Secondary | ICD-10-CM

## 2020-06-30 NOTE — Progress Notes (Signed)
Kindly inform the patient that EEG study was normal

## 2020-07-01 ENCOUNTER — Telehealth: Payer: Self-pay | Admitting: Emergency Medicine

## 2020-07-01 ENCOUNTER — Telehealth: Payer: Self-pay | Admitting: Medical

## 2020-07-01 NOTE — Telephone Encounter (Signed)
-----   Message from Garvin Fila, MD sent at 06/30/2020  5:45 PM EST ----- Kindly inform the patient that EEG study was normal

## 2020-07-01 NOTE — Telephone Encounter (Signed)
See his email message.  At this point he had a normal EEG brainwave study.  I do think it is reasonable to go ahead and refer to cardiology for event monitor testing.  He can certainly return with his wife to discuss concerns, evaluation thus far

## 2020-07-01 NOTE — Telephone Encounter (Signed)
Called patient and discussed Dr. Clydene Fake findings regarding EEG Study.  Patient asked what the next step is and I reviewed his office visit/plan.  Went over with patient, reminded him he has a follow up 10/08/20.  Discussed importance of getting plenty of sleep, no alcohol, no illegal substances and no driving.  Patient denied further questions, verbalized understanding and expressed appreciation for the phone call.

## 2020-07-03 ENCOUNTER — Telehealth: Payer: Self-pay | Admitting: Medical

## 2020-07-03 NOTE — Telephone Encounter (Signed)
I doubt the sertraline medication caused the syncope episode.   Although a side effect may be listed on the label, it doesn't necessarily mean a causation.   I have never seen that reaction on this or similar medication.  I'm not saying its impossible, but not likely the cause.   Lets follow up as planned. Hydrate with 80-100 ounces of water daily.  Limit or avoid alcohol.     I sent this to Mickel Baas and Delfino Lovett, whoever gets to it before end of today

## 2020-07-03 NOTE — Telephone Encounter (Signed)
Pt wife called and states that pt had a fall on Jan the 25 and had seizure like activity, and that pt has been on zoloft for like 3 years. And states that the neurology office seen him and said everything was fine, but she wonders if the medicine could be the problem since seizures are a side effect and confusion, I told her he would need to be seen in the office for a visit I made him a appt for Feb the 16, but she still wanted me to send you a message and see what you thought, She can be reached at [phone]

## 2020-07-03 NOTE — Telephone Encounter (Signed)
Need order for event monitoring.

## 2020-07-03 NOTE — Telephone Encounter (Signed)
[facility] sent pt a mychart message and it was read by pt

## 2020-07-06 ENCOUNTER — Other Ambulatory Visit: Payer: Self-pay

## 2020-07-06 DIAGNOSIS — R569 Unspecified convulsions: Secondary | ICD-10-CM

## 2020-07-06 DIAGNOSIS — R55 Syncope and collapse: Secondary | ICD-10-CM

## 2020-07-06 NOTE — Telephone Encounter (Signed)
I don't know what order to put in for even monitor.  I think we normally refer over to [facility] correct?  Unless we can order that through hospital

## 2020-07-06 NOTE — Telephone Encounter (Signed)
Order has been placed to [facility].

## 2020-07-08 ENCOUNTER — Other Ambulatory Visit: Payer: Self-pay

## 2020-07-08 ENCOUNTER — Telehealth: Payer: Self-pay | Admitting: General Practice

## 2020-07-08 ENCOUNTER — Ambulatory Visit (INDEPENDENT_AMBULATORY_CARE_PROVIDER_SITE_OTHER): Payer: BC Managed Care – PPO | Admitting: Medical

## 2020-07-08 ENCOUNTER — Encounter: Payer: Self-pay | Admitting: Medical

## 2020-07-08 VITALS — BP 110/78 | HR 91 | Ht 71.0 in | Wt 155.8 lb

## 2020-07-08 DIAGNOSIS — R42 Dizziness and giddiness: Secondary | ICD-10-CM

## 2020-07-08 DIAGNOSIS — F1990 Other psychoactive substance use, unspecified, uncomplicated: Secondary | ICD-10-CM

## 2020-07-08 DIAGNOSIS — R55 Syncope and collapse: Secondary | ICD-10-CM | POA: Insufficient documentation

## 2020-07-08 HISTORY — DX: Dizziness and giddiness: R42

## 2020-07-08 HISTORY — DX: Syncope and collapse: R55

## 2020-07-08 MED ORDER — TRIAMCINOLONE ACETONIDE 0.1 % EX CREA: 1.0000 "application " | TOPICAL_CREAM | Freq: Two times a day (BID) | CUTANEOUS | 0 refills | Status: DC

## 2020-07-08 NOTE — Telephone Encounter (Signed)
Brian Hess is calling wanting to confirm the patient was purposely enrolled for a heart monitor twice due to receiving another enrollment for the patient yesterday while the patient is still wearing the previous monitor that was sent. Please advise.

## 2020-07-08 NOTE — Telephone Encounter (Signed)
Cancel 07/06/20 enrollment for monitor.  There was a duplicate order.  Also, spoke with patient.  He was sent a bridge with 44M hypoallergenic electrodes.  He is having problems with electrodes not sticking.  Please send patient alternative sensitive skin electrodes.

## 2020-07-08 NOTE — Progress Notes (Signed)
Subjective:     Patient ID: Brian Hess, male   DOB: 1981-02-19, 40 y.o.   MRN: [id number]  Chief Complaint  Patient presents with  . Consult    Discuss medication and what could have been the reason for seizures    Here for f/u on syncope.   We did a virtual consult on 06/18/20 for same. He was seen in the emergency dept 06/16/20 for same.  Since that visit he saw neurology, had consult, had normal EEG.  He has had no additional episodes of syncope, but some days has felt dizziness on several occasions.    He denies numbness, tingling, vision loss, chest pain, shortness of breath, nausea, sweats.  He has had some recent blood in the stool which he gets occasionally.  He had a colonoscopy just a few years ago 2020.  He has not used any more marijuana or narcotic cough syrup since the episode of syncope.  He had been treated recently for respiratory illness and use some prescription cough medicine  He feels like he drinks plenty of fluids throughout the day.  He is not sure how the calories he is per day but rarely eats breakfast.  He typically eats lunch and dinner  He started to wear the Holter monitor through cardiology but is having a skin reaction to the adhesive patches he has contacted [facility] about trying a different adhesive  No other new complaint   History from the initial virtual consult: Was at [employer] 2 days ago shopping for groceries.  Was feeling cross eyed and dizzy.  Started feeling funny.  Got up to the front of store, was checking out at counter, and the next thing he knew was waking up in the ambulance.  Workers at [employer], thought he may have had a seizure.  He was unconscious for probably several minutes from the time he fell out to the time EMS arrived.  There was no consensus on whether there was seizure activity or not but he apparently did not come to shortly after falling out.    He denies recent dehydration, and actually felt like he was doing pretty  good job of hydration.  He had been taking some of the Tussionex cough syrup.  He apparently smokes marijuana as well.  He had an exposure to COVID earlier in the month and had some respiratory symptoms prior to this episode.  See virtual consult notes from 05/29/2020.  Overall he had improved but was still having some residual cough so he was using the Tussionex some.  He denies any significant fever, body aches chills or fatigue or dehydration days before the syncope episode  He denies chest pain, palpitations, edema, numbness, tingling, weakness.  No significant heart disease in the family other than one grandparent that had a pacemaker  He denies other drug use  Taken to the hospital by EMS, was seen in [facility].   He had numerous scans, labs, EKGs.  When he fell he hit his shoulder so he has been having neck pain and shoulder pain.  He had neck scans in the hospital that did not show a fracture.  He was prescribed ibuprofen and Robaxin.  He cannot take ibuprofen due to history of IBS and bad upset stomach.  He has tried the Robaxin but does not seem to be helping at all.  He notes that Dilaudid and other medicines in the hospital did not help either.  He is asking for something  else to help with pain and sleep  He also had an abrasion to the back of his left scalp that he says was not dealt with at the hospital.  He was sent home with a bloody mass.  Currently he just has an abrasion of the scalp without open wound and no more bleeding since few days ago  He denies any recent confusion, slurred speech, and only this 1 episode of syncope  Past Medical History:  Diagnosis Date  . Back pain   . Headache   . IBS (irritable bowel syndrome)   . Mood change 02/2020   doing fine on Setraline, but in years prior several prior medications  . Seizures ([place]) 06/07/2019   ? questionable   Current Outpatient Medications on File Prior to Visit  Medication Sig Dispense Refill  . sertraline  (ZOLOFT) 100 MG tablet Take 100 mg by mouth daily.    . methocarbamol (ROBAXIN) 500 MG tablet Take 1 tablet (500 mg total) by mouth every 8 (eight) hours as needed for muscle spasms. (Patient not taking: Reported on 07/08/2020) 20 tablet 0  . Multiple Vitamins-Minerals (EMERGEN-C IMMUNE PLUS) PACK Take 1 tablet by mouth 2 (two) times daily. (Patient not taking: Reported on 07/08/2020) 10 each 0   No current facility-administered medications on file prior to visit.     Family History  Problem Relation Age of Onset  . Diabetes Mother   . Depression Mother   . Cancer Maternal Grandfather        not sure what king  . Diabetes Maternal Grandfather   . Depression Father   . Other Father        electrocution, brain damage and finger amputation  . Diabetes Sister   . Diabetes Maternal Aunt   . Heart disease Maternal Grandmother        pacemaker  . Cancer Paternal Grandmother        breast  . Heart disease Paternal Grandfather   . Colon polyps Neg Hx   . Esophageal cancer Neg Hx   . Pancreatic cancer Neg Hx   . Rectal cancer Neg Hx   . Stomach cancer Neg Hx       Review of Systems As in subjective    Objective:   Physical Exam BP 110/78   Pulse 91   Ht 5' 11"  (1.803 m)   Wt 155 lb 12.8 oz (70.7 kg)   SpO2 95%   BMI 21.73 kg/m   Wt Readings from Last 3 Encounters:  07/08/20 155 lb 12.8 oz (70.7 kg)  06/18/20 152 lb 3.2 oz (69 kg)  06/18/20 154 lb (69.9 kg)   BP Readings from Last 3 Encounters:  07/08/20 110/78  06/18/20 114/81  06/16/20 (!) 106/58     General appearence: alert, no distress, WD/WN, lean white male HEENT: normocephalic, sclerae anicteric, PERRLA, EOMi, nares patent, no discharge or erythema, pharynx normal Oral cavity: MMM, no lesions Neck: supple, no lymphadenopathy, no thyromegaly, no masses, no bruits Heart: RRR, normal S1, S2, no murmurs Lungs: CTA bilaterally, no wheezes, rhonchi, or rales Musculoskeletal: nontender, no swelling, no obvious  deformity Extremities: no edema, no cyanosis, no clubbing Pulses: 2+ symmetric, upper and lower extremities, normal cap refill Neurological: alert, oriented x 3, CN2-12 intact, strength normal upper extremities and lower extremities, sensation normal throughout, DTRs 2+ throughout, no cerebellar signs, gait normal Psychiatric: normal affect, behavior normal, pleasant    I reviewed multiple imaging from 06/16/2020 emergency department visit including CT head, CT cervical  spine, MRI brain, MR cervical spine, MR angio head, MRV head.  I reviewed EKG.  I reviewed labs including ethanol, magnesium, complex metabolic panel, CBC, rapid urine drug screen, urinalysis  There was some degenerative disc disease of the cervical spine noted, there was slightly low potassium, slightly elevated glucose.  Positive for opiates and THC in the drug screen.  Otherwise results were fairly unremarkable      Assessment:     Encounter Diagnoses  Name Primary?  . Dizziness Yes  . Syncope, unspecified syncope type   . Substance use        Plan:      i reviewed his 06/16/2020 emergency department notes, EKG, CT and MRI of neck, CT and MRI of brain, MRV of head, numerous labs.  medicines reconciled  I reviewed his neurology notes from 06/29/20, normal EEG study from Dr. Leonie Man, 06/30/2020  We discussed potential differential and causes of his syncopal episode which could include medication adverse effect, substance abuse, dehydration and fatigue, hypoglycemia, panic attack, vasovagal syncope, pseudoseizure, or other  His recent drug screen at the hospital emergency department did show opiates and THC.   Around the date of the syncopal episode he had been using Tussionex prescription cough syrup as well as marijuana which could have potentially triggered this reaction.  Advised to use no more of the Tussionex.  Advise he abstain from marijuana  We discussed the need to make sure he is hydrating well  At this point he  will work with cardiology to find an adhesive sticker that will not react his skin.  I prescription cream below he can use for the current rash on his chest.  We will plan to do a event monitor for 7 days and turn it back in for cardiac monitoring  BMET lab today  If lab is normal and cardiac event monitor normal, advise he needs to monitor his calories and make sure he is getting at least 2200 cal/day, make sure he is getting 80 to 100 ounces of fluids per day particular water.  Consider pseudoseizure, mental health consult, ENT consult, or other going forward    Hanson was seen today for consult.  Diagnoses and all orders for this visit:  Dizziness -     Basic metabolic panel  Syncope, unspecified syncope type -     Basic metabolic panel  Substance use  Other orders -     triamcinolone (KENALOG) 0.1 %; Apply 1 application topically 2 (two) times daily.  f/u pending cardiac event monitor, labs.

## 2020-07-09 LAB — BASIC METABOLIC PANEL
BUN/Creatinine Ratio: 12 (ref 9–20)
BUN: 11 mg/dL (ref 6–24)
CO2: 24 mmol/L (ref 20–29)
Calcium: 9.6 mg/dL (ref 8.7–10.2)
Chloride: 102 mmol/L (ref 96–106)
Creatinine, Ser: 0.89 mg/dL (ref 0.76–1.27)
GFR calc Af Amer: 123 mL/min/{1.73_m2} (ref >59)
GFR calc non Af Amer: 106 mL/min/{1.73_m2} (ref >59)
Glucose: 83 mg/dL (ref 65–99)
Potassium: 4.8 mmol/L (ref 3.5–5.2)
Sodium: 140 mmol/L (ref 134–144)

## 2020-07-16 ENCOUNTER — Encounter: Payer: Medicaid Other | Admitting: Medical

## 2020-08-10 ENCOUNTER — Other Ambulatory Visit: Payer: Self-pay | Admitting: Medical

## 2020-08-10 MED ORDER — SERTRALINE HCL 100 MG PO TABS: 100.0000 mg | ORAL_TABLET | Freq: Every day | ORAL | 2 refills | Status: DC

## 2020-10-06 ENCOUNTER — Ambulatory Visit (INDEPENDENT_AMBULATORY_CARE_PROVIDER_SITE_OTHER): Payer: BC Managed Care – PPO | Admitting: Medical

## 2020-10-06 ENCOUNTER — Other Ambulatory Visit: Payer: Self-pay

## 2020-10-06 ENCOUNTER — Encounter: Payer: Self-pay | Admitting: Medical

## 2020-10-06 VITALS — BP 114/84 | HR 95 | Ht 71.0 in | Wt 158.0 lb

## 2020-10-06 DIAGNOSIS — M549 Dorsalgia, unspecified: Secondary | ICD-10-CM | POA: Diagnosis not present

## 2020-10-06 DIAGNOSIS — M542 Cervicalgia: Secondary | ICD-10-CM

## 2020-10-06 DIAGNOSIS — M436 Torticollis: Secondary | ICD-10-CM

## 2020-10-06 DIAGNOSIS — R29898 Other symptoms and signs involving the musculoskeletal system: Secondary | ICD-10-CM

## 2020-10-06 DIAGNOSIS — M62838 Other muscle spasm: Secondary | ICD-10-CM

## 2020-10-06 HISTORY — DX: Other muscle spasm: M62.838

## 2020-10-06 HISTORY — DX: Torticollis: M43.6

## 2020-10-06 HISTORY — DX: Other symptoms and signs involving the musculoskeletal system: R29.898

## 2020-10-06 HISTORY — DX: Dorsalgia, unspecified: M54.9

## 2020-10-06 MED ORDER — METHOCARBAMOL 500 MG PO TABS
500.0000 mg | ORAL_TABLET | Freq: Two times a day (BID) | ORAL | 0 refills | Status: DC | PRN
Start: 2020-10-06 — End: 2021-06-04
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
## 2020-10-07 ENCOUNTER — Other Ambulatory Visit: Payer: Self-pay

## 2020-10-07 DIAGNOSIS — M549 Dorsalgia, unspecified: Secondary | ICD-10-CM

## 2020-10-07 DIAGNOSIS — M542 Cervicalgia: Secondary | ICD-10-CM

## 2020-10-07 DIAGNOSIS — R29898 Other symptoms and signs involving the musculoskeletal system: Secondary | ICD-10-CM

## 2020-10-08 ENCOUNTER — Encounter: Payer: Self-pay | Admitting: Neurology

## 2020-10-08 ENCOUNTER — Ambulatory Visit (INDEPENDENT_AMBULATORY_CARE_PROVIDER_SITE_OTHER): Payer: BC Managed Care – PPO | Admitting: Neurology

## 2020-10-08 ENCOUNTER — Telehealth: Payer: Self-pay | Admitting: Neurology

## 2020-10-08 VITALS — BP 108/65 | HR 85 | Ht 70.0 in | Wt 158.6 lb

## 2020-10-08 DIAGNOSIS — R29818 Other symptoms and signs involving the nervous system: Secondary | ICD-10-CM

## 2020-10-08 DIAGNOSIS — M4802 Spinal stenosis, cervical region: Secondary | ICD-10-CM | POA: Diagnosis not present

## 2020-10-08 NOTE — Telephone Encounter (Signed)
Faxed referral to [facility]. Marked as urgent & requested MD on call. Phone: [phone]. Fax: [phone].
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
## 2020-10-08 NOTE — Patient Instructions (Signed)
I had a long discussion with the patient with regards to his solitary seizure and discussed results of EEG and MRI findings and recommend we hold off on anticonvulsants in view of his normal neurological exam and EEG and MRI brain findings.  He was advised to stay away from seizure provoking stimuli like sleep deprivation, extremes of physical exertion and activity and avoid alcohol and stimulants and marijuana.  He was advised not to drive till 6 months from his seizure as per [facility].  He has new complaints of paresthesias in his arms and trunk with bending his neck down which is likely from cervical spinal stenosis given his previous abnormal MRI in January.  We will make an urgent referral to neurosurgery to discuss decompressive cervical spine surgery.  He will return for follow-up with me in the future in 6 months or call earlier if necessary.

## 2020-10-13 ENCOUNTER — Other Ambulatory Visit: Payer: Self-pay | Admitting: Medical

## 2020-12-09 DIAGNOSIS — M542 Cervicalgia: Secondary | ICD-10-CM | POA: Diagnosis not present

## 2020-12-15 DIAGNOSIS — M5412 Radiculopathy, cervical region: Secondary | ICD-10-CM | POA: Diagnosis not present

## 2020-12-15 DIAGNOSIS — M9901 Segmental and somatic dysfunction of cervical region: Secondary | ICD-10-CM | POA: Diagnosis not present

## 2020-12-15 DIAGNOSIS — M9904 Segmental and somatic dysfunction of sacral region: Secondary | ICD-10-CM | POA: Diagnosis not present

## 2020-12-15 DIAGNOSIS — M542 Cervicalgia: Secondary | ICD-10-CM | POA: Diagnosis not present

## 2020-12-15 DIAGNOSIS — M9902 Segmental and somatic dysfunction of thoracic region: Secondary | ICD-10-CM | POA: Diagnosis not present

## 2020-12-16 ENCOUNTER — Other Ambulatory Visit: Payer: Self-pay

## 2020-12-16 ENCOUNTER — Telehealth: Payer: BC Managed Care – PPO | Admitting: Medical

## 2020-12-22 DIAGNOSIS — M9904 Segmental and somatic dysfunction of sacral region: Secondary | ICD-10-CM | POA: Diagnosis not present

## 2020-12-22 DIAGNOSIS — M9902 Segmental and somatic dysfunction of thoracic region: Secondary | ICD-10-CM | POA: Diagnosis not present

## 2020-12-22 DIAGNOSIS — M9901 Segmental and somatic dysfunction of cervical region: Secondary | ICD-10-CM | POA: Diagnosis not present

## 2021-01-05 DIAGNOSIS — M9904 Segmental and somatic dysfunction of sacral region: Secondary | ICD-10-CM | POA: Diagnosis not present

## 2021-01-05 DIAGNOSIS — M9902 Segmental and somatic dysfunction of thoracic region: Secondary | ICD-10-CM | POA: Diagnosis not present

## 2021-01-05 DIAGNOSIS — M9901 Segmental and somatic dysfunction of cervical region: Secondary | ICD-10-CM | POA: Diagnosis not present

## 2021-01-06 DIAGNOSIS — M9902 Segmental and somatic dysfunction of thoracic region: Secondary | ICD-10-CM | POA: Diagnosis not present

## 2021-01-06 DIAGNOSIS — M9901 Segmental and somatic dysfunction of cervical region: Secondary | ICD-10-CM | POA: Diagnosis not present

## 2021-01-06 DIAGNOSIS — M9904 Segmental and somatic dysfunction of sacral region: Secondary | ICD-10-CM | POA: Diagnosis not present

## 2021-01-11 DIAGNOSIS — M9901 Segmental and somatic dysfunction of cervical region: Secondary | ICD-10-CM | POA: Diagnosis not present

## 2021-01-11 DIAGNOSIS — M9902 Segmental and somatic dysfunction of thoracic region: Secondary | ICD-10-CM | POA: Diagnosis not present

## 2021-01-11 DIAGNOSIS — M9904 Segmental and somatic dysfunction of sacral region: Secondary | ICD-10-CM | POA: Diagnosis not present

## 2021-01-13 DIAGNOSIS — M9904 Segmental and somatic dysfunction of sacral region: Secondary | ICD-10-CM | POA: Diagnosis not present

## 2021-01-13 DIAGNOSIS — M9902 Segmental and somatic dysfunction of thoracic region: Secondary | ICD-10-CM | POA: Diagnosis not present

## 2021-01-13 DIAGNOSIS — M9901 Segmental and somatic dysfunction of cervical region: Secondary | ICD-10-CM | POA: Diagnosis not present

## 2021-01-15 DIAGNOSIS — M9904 Segmental and somatic dysfunction of sacral region: Secondary | ICD-10-CM | POA: Diagnosis not present

## 2021-01-15 DIAGNOSIS — M9902 Segmental and somatic dysfunction of thoracic region: Secondary | ICD-10-CM | POA: Diagnosis not present

## 2021-01-15 DIAGNOSIS — M9901 Segmental and somatic dysfunction of cervical region: Secondary | ICD-10-CM | POA: Diagnosis not present

## 2021-01-18 DIAGNOSIS — M9904 Segmental and somatic dysfunction of sacral region: Secondary | ICD-10-CM | POA: Diagnosis not present

## 2021-01-18 DIAGNOSIS — M9901 Segmental and somatic dysfunction of cervical region: Secondary | ICD-10-CM | POA: Diagnosis not present

## 2021-01-18 DIAGNOSIS — M9902 Segmental and somatic dysfunction of thoracic region: Secondary | ICD-10-CM | POA: Diagnosis not present

## 2021-01-20 DIAGNOSIS — M9902 Segmental and somatic dysfunction of thoracic region: Secondary | ICD-10-CM | POA: Diagnosis not present

## 2021-01-20 DIAGNOSIS — M9901 Segmental and somatic dysfunction of cervical region: Secondary | ICD-10-CM | POA: Diagnosis not present

## 2021-01-20 DIAGNOSIS — M9904 Segmental and somatic dysfunction of sacral region: Secondary | ICD-10-CM | POA: Diagnosis not present

## 2021-01-20 DIAGNOSIS — M9903 Segmental and somatic dysfunction of lumbar region: Secondary | ICD-10-CM | POA: Diagnosis not present

## 2021-01-27 DIAGNOSIS — M9904 Segmental and somatic dysfunction of sacral region: Secondary | ICD-10-CM | POA: Diagnosis not present

## 2021-01-27 DIAGNOSIS — M9901 Segmental and somatic dysfunction of cervical region: Secondary | ICD-10-CM | POA: Diagnosis not present

## 2021-01-27 DIAGNOSIS — M9903 Segmental and somatic dysfunction of lumbar region: Secondary | ICD-10-CM | POA: Diagnosis not present

## 2021-01-27 DIAGNOSIS — M9902 Segmental and somatic dysfunction of thoracic region: Secondary | ICD-10-CM | POA: Diagnosis not present

## 2021-02-01 DIAGNOSIS — M9904 Segmental and somatic dysfunction of sacral region: Secondary | ICD-10-CM | POA: Diagnosis not present

## 2021-02-01 DIAGNOSIS — M9901 Segmental and somatic dysfunction of cervical region: Secondary | ICD-10-CM | POA: Diagnosis not present

## 2021-02-01 DIAGNOSIS — M9903 Segmental and somatic dysfunction of lumbar region: Secondary | ICD-10-CM | POA: Diagnosis not present

## 2021-02-01 DIAGNOSIS — M9902 Segmental and somatic dysfunction of thoracic region: Secondary | ICD-10-CM | POA: Diagnosis not present

## 2021-02-08 DIAGNOSIS — M9904 Segmental and somatic dysfunction of sacral region: Secondary | ICD-10-CM | POA: Diagnosis not present

## 2021-02-08 DIAGNOSIS — M9901 Segmental and somatic dysfunction of cervical region: Secondary | ICD-10-CM | POA: Diagnosis not present

## 2021-02-08 DIAGNOSIS — M9903 Segmental and somatic dysfunction of lumbar region: Secondary | ICD-10-CM | POA: Diagnosis not present

## 2021-02-08 DIAGNOSIS — M5412 Radiculopathy, cervical region: Secondary | ICD-10-CM | POA: Diagnosis not present

## 2021-02-08 DIAGNOSIS — M9902 Segmental and somatic dysfunction of thoracic region: Secondary | ICD-10-CM | POA: Diagnosis not present

## 2021-02-12 DIAGNOSIS — M9901 Segmental and somatic dysfunction of cervical region: Secondary | ICD-10-CM | POA: Diagnosis not present

## 2021-02-12 DIAGNOSIS — M9904 Segmental and somatic dysfunction of sacral region: Secondary | ICD-10-CM | POA: Diagnosis not present

## 2021-02-12 DIAGNOSIS — M9903 Segmental and somatic dysfunction of lumbar region: Secondary | ICD-10-CM | POA: Diagnosis not present

## 2021-02-12 DIAGNOSIS — M9902 Segmental and somatic dysfunction of thoracic region: Secondary | ICD-10-CM | POA: Diagnosis not present

## 2021-02-15 DIAGNOSIS — M9901 Segmental and somatic dysfunction of cervical region: Secondary | ICD-10-CM | POA: Diagnosis not present

## 2021-02-15 DIAGNOSIS — M9903 Segmental and somatic dysfunction of lumbar region: Secondary | ICD-10-CM | POA: Diagnosis not present

## 2021-02-15 DIAGNOSIS — M9902 Segmental and somatic dysfunction of thoracic region: Secondary | ICD-10-CM | POA: Diagnosis not present

## 2021-02-15 DIAGNOSIS — M9904 Segmental and somatic dysfunction of sacral region: Secondary | ICD-10-CM | POA: Diagnosis not present

## 2021-02-18 DIAGNOSIS — M9901 Segmental and somatic dysfunction of cervical region: Secondary | ICD-10-CM | POA: Diagnosis not present

## 2021-02-18 DIAGNOSIS — M9902 Segmental and somatic dysfunction of thoracic region: Secondary | ICD-10-CM | POA: Diagnosis not present

## 2021-02-18 DIAGNOSIS — M9904 Segmental and somatic dysfunction of sacral region: Secondary | ICD-10-CM | POA: Diagnosis not present

## 2021-02-18 DIAGNOSIS — M9903 Segmental and somatic dysfunction of lumbar region: Secondary | ICD-10-CM | POA: Diagnosis not present

## 2021-02-23 ENCOUNTER — Other Ambulatory Visit: Payer: Self-pay | Admitting: Medical

## 2021-03-03 DIAGNOSIS — M9904 Segmental and somatic dysfunction of sacral region: Secondary | ICD-10-CM | POA: Diagnosis not present

## 2021-03-03 DIAGNOSIS — M9901 Segmental and somatic dysfunction of cervical region: Secondary | ICD-10-CM | POA: Diagnosis not present

## 2021-03-03 DIAGNOSIS — M9903 Segmental and somatic dysfunction of lumbar region: Secondary | ICD-10-CM | POA: Diagnosis not present

## 2021-03-03 DIAGNOSIS — M9902 Segmental and somatic dysfunction of thoracic region: Secondary | ICD-10-CM | POA: Diagnosis not present

## 2021-03-05 DIAGNOSIS — M9903 Segmental and somatic dysfunction of lumbar region: Secondary | ICD-10-CM | POA: Diagnosis not present

## 2021-03-05 DIAGNOSIS — M9902 Segmental and somatic dysfunction of thoracic region: Secondary | ICD-10-CM | POA: Diagnosis not present

## 2021-03-05 DIAGNOSIS — M9904 Segmental and somatic dysfunction of sacral region: Secondary | ICD-10-CM | POA: Diagnosis not present

## 2021-03-05 DIAGNOSIS — M9901 Segmental and somatic dysfunction of cervical region: Secondary | ICD-10-CM | POA: Diagnosis not present

## 2021-03-08 ENCOUNTER — Encounter: Payer: 59 | Admitting: Medical

## 2021-03-16 DIAGNOSIS — M9901 Segmental and somatic dysfunction of cervical region: Secondary | ICD-10-CM | POA: Diagnosis not present

## 2021-03-16 DIAGNOSIS — M9903 Segmental and somatic dysfunction of lumbar region: Secondary | ICD-10-CM | POA: Diagnosis not present

## 2021-03-16 DIAGNOSIS — M9904 Segmental and somatic dysfunction of sacral region: Secondary | ICD-10-CM | POA: Diagnosis not present

## 2021-03-16 DIAGNOSIS — M9902 Segmental and somatic dysfunction of thoracic region: Secondary | ICD-10-CM | POA: Diagnosis not present

## 2021-03-16 DIAGNOSIS — M5412 Radiculopathy, cervical region: Secondary | ICD-10-CM | POA: Diagnosis not present

## 2021-03-26 DIAGNOSIS — M9904 Segmental and somatic dysfunction of sacral region: Secondary | ICD-10-CM | POA: Diagnosis not present

## 2021-03-26 DIAGNOSIS — M9901 Segmental and somatic dysfunction of cervical region: Secondary | ICD-10-CM | POA: Diagnosis not present

## 2021-03-26 DIAGNOSIS — M9903 Segmental and somatic dysfunction of lumbar region: Secondary | ICD-10-CM | POA: Diagnosis not present

## 2021-03-29 DIAGNOSIS — M9901 Segmental and somatic dysfunction of cervical region: Secondary | ICD-10-CM | POA: Diagnosis not present

## 2021-03-29 DIAGNOSIS — M9904 Segmental and somatic dysfunction of sacral region: Secondary | ICD-10-CM | POA: Diagnosis not present

## 2021-03-29 DIAGNOSIS — M9902 Segmental and somatic dysfunction of thoracic region: Secondary | ICD-10-CM | POA: Diagnosis not present

## 2021-03-29 DIAGNOSIS — M9903 Segmental and somatic dysfunction of lumbar region: Secondary | ICD-10-CM | POA: Diagnosis not present

## 2021-03-31 ENCOUNTER — Other Ambulatory Visit: Payer: Self-pay | Admitting: Medical

## 2021-04-12 ENCOUNTER — Telehealth: Payer: Self-pay

## 2021-04-12 ENCOUNTER — Other Ambulatory Visit: Payer: Self-pay | Admitting: Internal Medicine

## 2021-04-12 ENCOUNTER — Encounter: Payer: Self-pay | Admitting: Neurology

## 2021-04-12 ENCOUNTER — Other Ambulatory Visit: Payer: Self-pay | Admitting: Medical

## 2021-04-12 MED ORDER — SERTRALINE HCL 100 MG PO TABS: ORAL_TABLET | ORAL | 0 refills | Status: DC

## 2021-04-12 NOTE — Telephone Encounter (Signed)
Pt. Called stating he was dismissed as a pt. Here on 03/09/21 but he needs a refill on his sertraline his last apt was 10/06/20. He said he has already been out of the medicine for two days now.

## 2021-04-19 ENCOUNTER — Ambulatory Visit: Payer: BC Managed Care – PPO | Admitting: Neurology

## 2021-04-21 ENCOUNTER — Ambulatory Visit: Payer: BC Managed Care – PPO | Admitting: Neurology

## 2021-05-10 ENCOUNTER — Other Ambulatory Visit: Payer: Self-pay | Admitting: Medical

## 2021-06-04 ENCOUNTER — Ambulatory Visit (INDEPENDENT_AMBULATORY_CARE_PROVIDER_SITE_OTHER): Payer: BC Managed Care – PPO | Admitting: Family

## 2021-06-04 ENCOUNTER — Encounter: Payer: Self-pay | Admitting: Family

## 2021-06-04 ENCOUNTER — Other Ambulatory Visit: Payer: Self-pay

## 2021-06-04 VITALS — BP 112/80 | HR 91 | Temp 98.6°F | Ht 70.0 in | Wt 151.6 lb

## 2021-06-04 DIAGNOSIS — F32A Depression, unspecified: Secondary | ICD-10-CM

## 2021-06-04 DIAGNOSIS — F419 Anxiety disorder, unspecified: Secondary | ICD-10-CM | POA: Diagnosis not present

## 2021-06-04 HISTORY — DX: Anxiety disorder, unspecified: F41.9

## 2021-06-04 MED ORDER — SERTRALINE HCL 100 MG PO TABS: ORAL_TABLET | ORAL | 1 refills | Status: DC

## 2021-06-04 NOTE — Progress Notes (Signed)
New Patient Office Visit  Subjective:  Patient ID: Brian Hess, male    DOB: Feb 13, 1981  Age: 41 y.o. MRN: [id number]  CC:  Chief Complaint  Patient presents with   Depression   Establish Care   Annual Exam    HPI Brian Hess presents for establishing care and to discuss one chronic problem.  Anxiety/Depression: Patient complains of anxiety disorder and depressive disorder .   He has the following symptoms: none.  Onset of symptoms was approximately 5 years ago, He denies current suicidal and homicidal ideation.  Possible organic causes contributing are: none.  Risk factors: previous episode of depression  Previous treatment includes Zoloft and individual therapy.  He complains of the following side effects from the treatment: none. Depression screen [facility] 2/9 06/04/2021  Decreased Interest -  Down, Depressed, Hopeless 0  PHQ - 2 Score 0  Altered sleeping 0  Tired, decreased energy 0  Change in appetite 0  Feeling bad or failure about yourself  0  Trouble concentrating 0  Moving slowly or fidgety/restless 0  Suicidal thoughts 0  PHQ-9 Score 0  Difficult doing work/chores Not difficult at all    Past Medical History:  Diagnosis Date   Back pain    Encounter for health maintenance examination in adult 03/06/2020   Encounter for health maintenance examination in adult 03/06/2020   Headache    High risk medication use 06/18/2020   Hypotension 06/18/2020   IBS (irritable bowel syndrome)    Leg numbness 03/06/2020   Mood change 02/2020   doing fine on Setraline, but in years prior several prior medications   Muscle spasms of neck 10/06/2020   Seizures ([place]) 06/07/2019   ? questionable   Syncope 07/08/2020   Torticollis 10/06/2020   Vaccine counseling 03/06/2020    Past Surgical History:  Procedure Laterality Date   COLONOSCOPY  2019   blood in stool; 1 polyp removed   [place]   right    Family History  Problem Relation Age of Onset   Diabetes  Mother    Depression Mother    Cancer Maternal Grandfather        not sure what king   Diabetes Maternal Grandfather    Depression Father    Other Father        electrocution, brain damage and finger amputation   Diabetes Sister    Diabetes Maternal Aunt    Heart disease Maternal Grandmother        pacemaker   Cancer Paternal Grandmother        breast   Heart disease Paternal Grandfather    Colon polyps Neg Hx    Esophageal cancer Neg Hx    Pancreatic cancer Neg Hx    Rectal cancer Neg Hx    Stomach cancer Neg Hx     Social History   Socioeconomic History   Marital status: Married    Spouse name: [name]   Number of children: Not on file   Years of education: Not on file   Highest education level: Not on file  Occupational History   Occupation: full time  Tobacco Use   Smoking status: Every Day    Packs/day: 0.50    Years: 20.00    Pack years: 10.00    Types: Cigarettes   Smokeless tobacco: Never  Vaping Use   Vaping Use: Never used  Substance and Sexual Activity   Alcohol use: No   Drug use: Yes  Types: Marijuana    Comment: LSD, Physlobin   Sexual activity: Not on file  Other Topics Concern   Not on file  Social History Narrative   Lives with wife and 2 kids.     Right Handed   Drinks 5-7 cups caffeine daily   Social Determinants of Health   Financial Resource Strain: Not on file  Food Insecurity: Not on file  Transportation Needs: Not on file  Physical Activity: Not on file  Stress: Not on file  Social Connections: Not on file  Intimate Partner Violence: Not on file    Objective:   Today's Vitals: BP 112/80    Pulse 91    Temp 98.6 F (37 C) (Temporal)    Ht 5\' 10"  (1.778 m)    Wt 151 lb 9.6 oz (68.8 kg)    SpO2 96%    BMI 21.75 kg/m   Physical Exam Vitals and nursing note reviewed.  Constitutional:      General: He is not in acute distress.    Appearance: Normal appearance.  HENT:     Head: Normocephalic.  Cardiovascular:     Rate  and Rhythm: Normal rate and regular rhythm.  Pulmonary:     Effort: Pulmonary effort is normal.     Breath sounds: Normal breath sounds.  Musculoskeletal:        General: Normal range of motion.     Cervical back: Normal range of motion.  Skin:    General: Skin is warm and dry.  Neurological:     Mental Status: He is alert and oriented to person, place, and time.  Psychiatric:        Mood and Affect: Mood normal.    Assessment & Plan:   Problem List Items Addressed This Visit       Other   Anxiety and depression - Primary    Chronic, stable - managed with Zoloft      Relevant Medications   sertraline (ZOLOFT) 100 MG tablet    Outpatient Encounter Medications as of 06/04/2021  Medication Sig   [DISCONTINUED] sertraline (ZOLOFT) 100 MG tablet TAKE 1 TABLET(100 MG) BY MOUTH DAILY   sertraline (ZOLOFT) 100 MG tablet TAKE 1 TABLET(100 MG) BY MOUTH DAILY   [DISCONTINUED] methocarbamol (ROBAXIN) 500 MG tablet Take 1 tablet (500 mg total) by mouth 2 (two) times daily as needed for muscle spasms.   No facility-administered encounter medications on file as of 06/04/2021.    Follow-up: Return in about 6 months (around 12/02/2021) for med refills, AND appt for Complete physical w/fasting labs.   Jeanie Sewer, NP

## 2021-06-04 NOTE — Patient Instructions (Signed)
Welcome to [facility]! It was a pleasure meeting you today.  As discussed, I have sent a refill for your Zoloft.  Please schedule a 6 month follow up visit today for refills, and an annual wellness visit with fasting labs.    PLEASE NOTE:  If you had any LAB tests please let us know if you have not heard back within a few days. You may see your results on MyChart before we have a chance to review them but we will give you a call once they are reviewed by [place]. If we ordered any REFERRALS today, please let us know if you have not heard from their office within the next week.  Let us know through MyChart if you are needing REFILLS, or have your pharmacy send [place] the request. You can also use MyChart to communicate with me or any office staff.  Please try these tips to maintain a healthy lifestyle:  Eat most of your calories during the day when you are active. Eliminate processed foods including packaged sweets (pies, cakes, cookies), reduce intake of potatoes, white bread, white pasta, and white rice. Look for whole grain options, oat flour or almond flour.  Each meal should contain half fruits/vegetables, one quarter protein, and one quarter carbs (no bigger than a computer mouse).  Cut down on sweet beverages. This includes juice, soda, and sweet tea. Also watch fruit intake, though this is a healthier sweet option, it still contains natural sugar! Limit to 3 servings daily.  Drink at least 1 glass of water with each meal and aim for at least 8 glasses per day  Exercise at least 150 minutes every week.

## 2021-06-04 NOTE — Assessment & Plan Note (Signed)
Chronic, stable - managed with Zoloft

## 2021-06-11 ENCOUNTER — Encounter: Payer: Self-pay | Admitting: Family

## 2021-06-15 ENCOUNTER — Other Ambulatory Visit: Payer: Self-pay

## 2021-06-15 DIAGNOSIS — F32A Depression, unspecified: Secondary | ICD-10-CM

## 2021-06-16 ENCOUNTER — Ambulatory Visit: Payer: BC Managed Care – PPO | Admitting: Family

## 2021-12-13 ENCOUNTER — Other Ambulatory Visit: Payer: Self-pay | Admitting: Family

## 2021-12-13 DIAGNOSIS — F32A Depression, unspecified: Secondary | ICD-10-CM

## 2022-01-05 IMAGING — CT CT HEAD W/O CM
4 series · 16 of 47 positions shown, 18 images · non-contrast
Comparison: 09/17/2015

CLINICAL DATA: Fell, hit back of head, occipital scalp laceration,
seizure

EXAM:
CT HEAD WITHOUT CONTRAST
TECHNIQUE: Contiguous axial images were obtained from the base of the skull
through the vertex without intravenous contrast.

[Series 3: head without · axial · non-contrast · 0.42mm/px · z∈[-288,-168]mm · 7 of 32 slices shown, 9 images]
[im 4/32  brain]
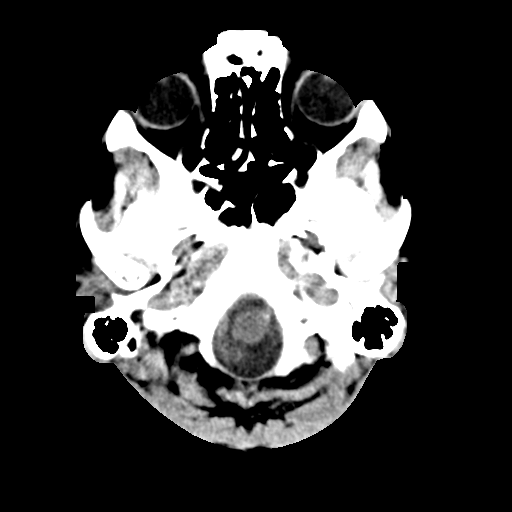
[im 4/32  bone]
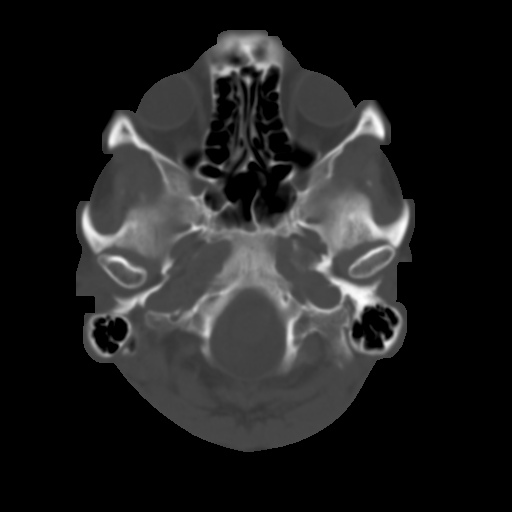
[im 8/32  brain]
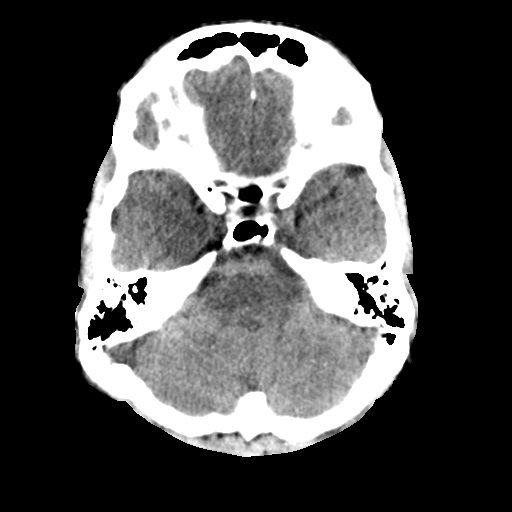
[im 12/32  brain]
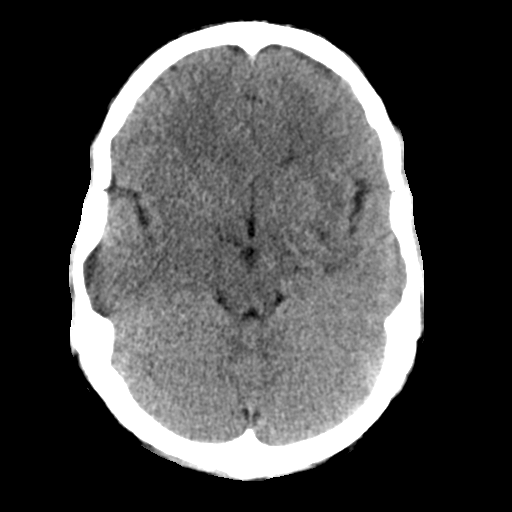
[im 16/32  brain]
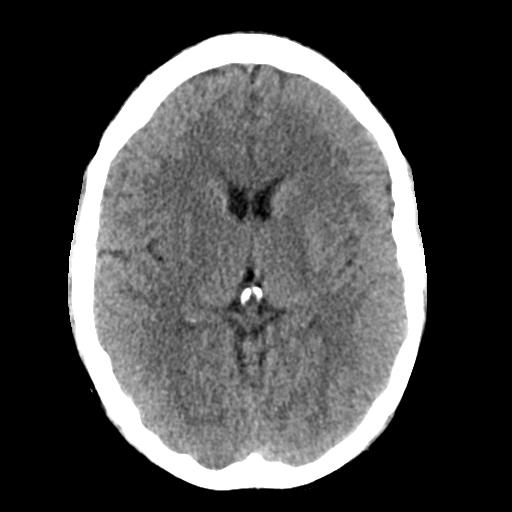
[im 20/32  brain]
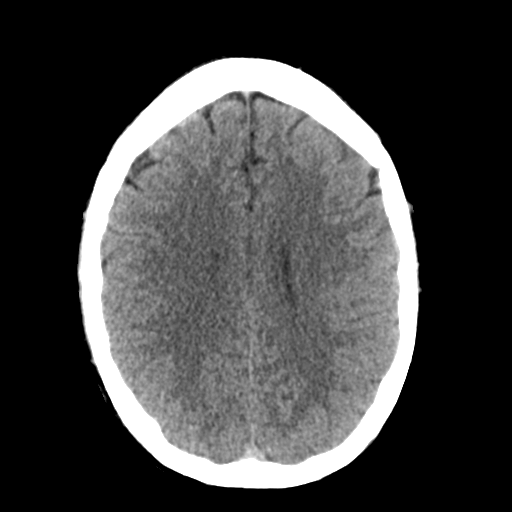
[im 20/32  bone]
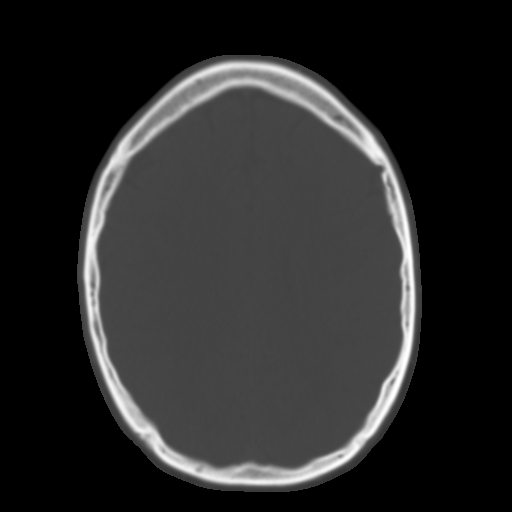
[im 24/32  brain]
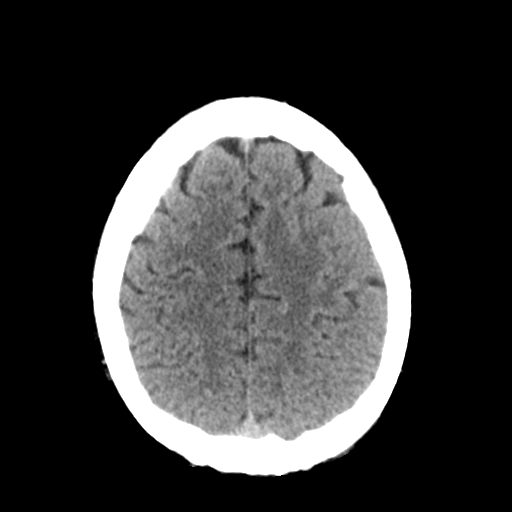
[im 28/32  brain]
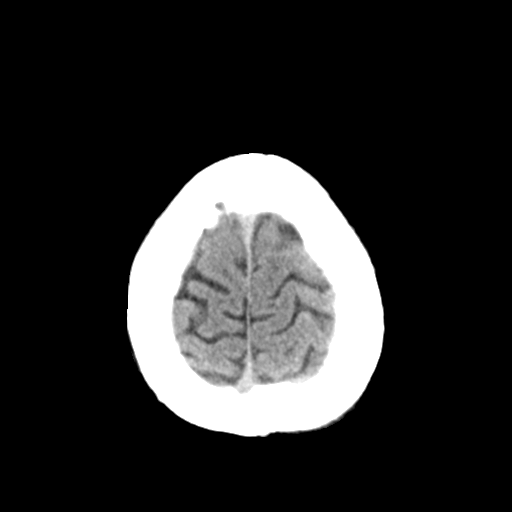

[Series 4: head bone · axial · 0.42mm/px · z∈[-289,-257]mm · 3 of 79 slices shown]
[im 8/79  bone]
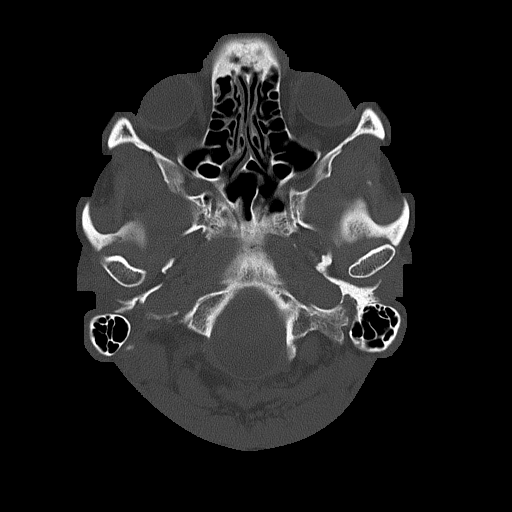
[im 16/79  bone]
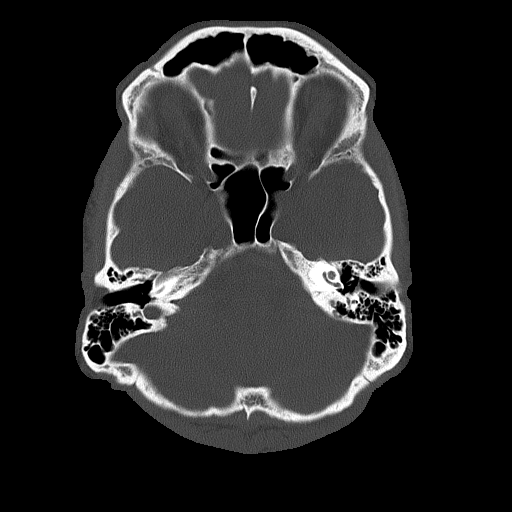
[im 24/79  bone]
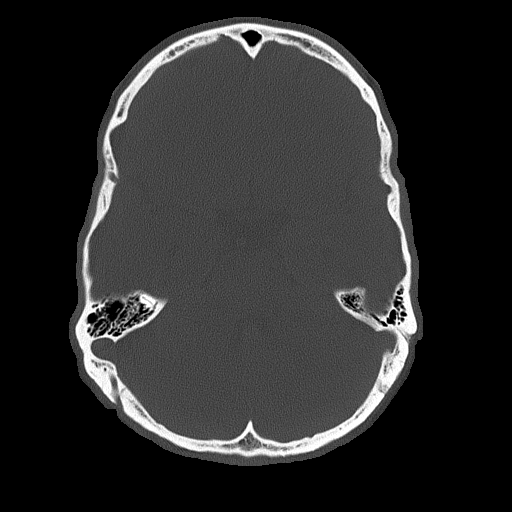

[Series 5: head without cor · coronal · non-contrast · 0.34mm/px · 3 of 69 slices shown]
[im 23/69  brain]
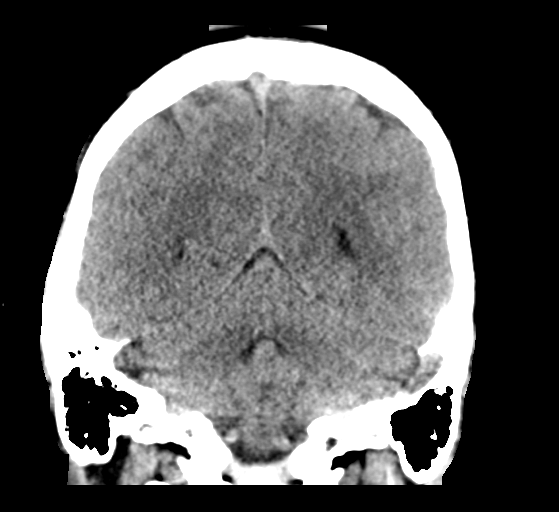
[im 31/69  brain]
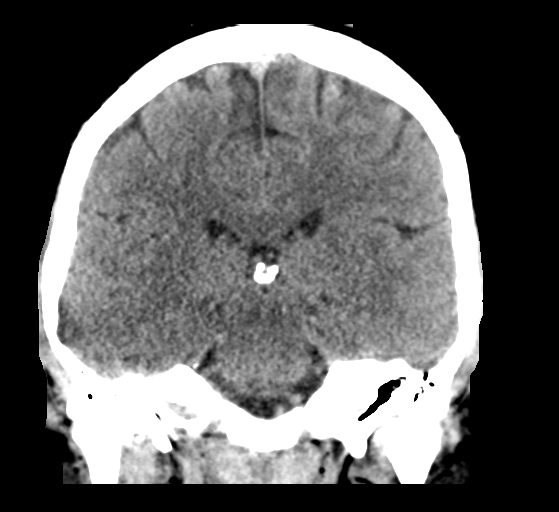
[im 38/69  brain]
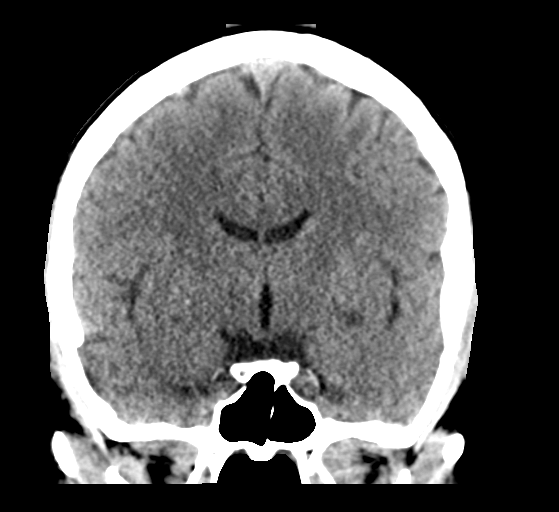

[Series 6: head without sag · sagittal · non-contrast · 0.34mm/px · 3 of 55 slices shown]
[im 19/55  brain]
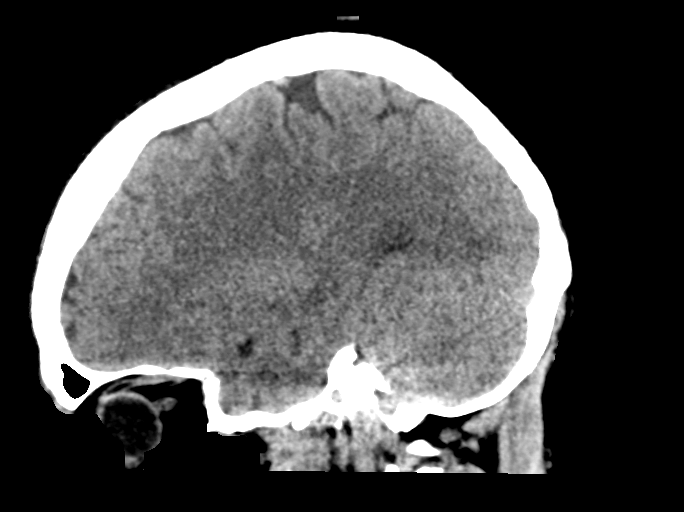
[im 28/55  brain]
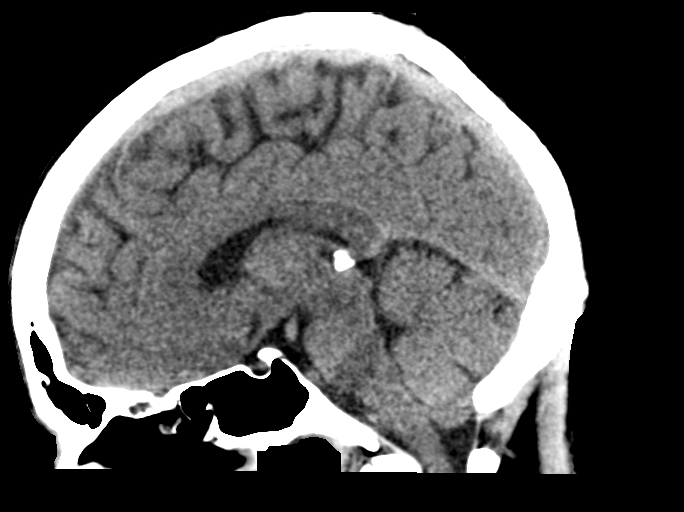
[im 37/55  brain]
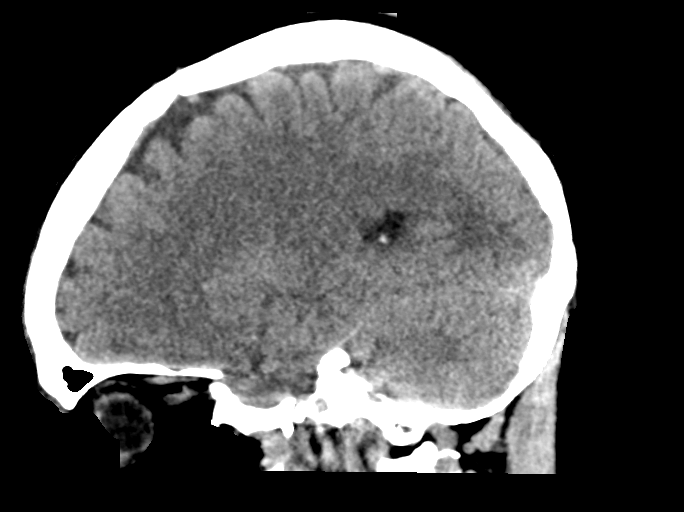

[16 of 47 positions shown; findings below may reference images not displayed]

FINDINGS: Brain: No acute infarct or hemorrhage. Lateral ventricles and
midline structures are unremarkable. No acute extra-axial fluid
collections. No mass effect.

Vascular: No hyperdense vessel or unexpected calcification.

Skull: Minimal left occipital scalp hematoma. No underlying
fractures. Remainder of the calvarium is unremarkable.

Sinuses/Orbits: No acute finding.

Other: None.
IMPRESSION: 1. No acute intracranial process.

## 2022-01-27 ENCOUNTER — Emergency Department (HOSPITAL_COMMUNITY): Payer: BC Managed Care – PPO

## 2022-01-27 ENCOUNTER — Encounter (HOSPITAL_COMMUNITY): Payer: Self-pay | Admitting: Emergency Medicine

## 2022-01-27 ENCOUNTER — Emergency Department (HOSPITAL_COMMUNITY)
Admission: EM | Admit: 2022-01-27 | Discharge: 2022-01-27 | Disposition: A | Discharge status: Home / Self Care | Payer: Worker's Compensation | Attending: Emergency Medicine | Admitting: Emergency Medicine

## 2022-01-27 DIAGNOSIS — E162 Hypoglycemia, unspecified: Secondary | ICD-10-CM | POA: Diagnosis not present

## 2022-01-27 DIAGNOSIS — E860 Dehydration: Secondary | ICD-10-CM | POA: Diagnosis not present

## 2022-01-27 DIAGNOSIS — E161 Other hypoglycemia: Secondary | ICD-10-CM | POA: Diagnosis not present

## 2022-01-27 DIAGNOSIS — W19XXXA Unspecified fall, initial encounter: Secondary | ICD-10-CM | POA: Insufficient documentation

## 2022-01-27 DIAGNOSIS — R55 Syncope and collapse: Secondary | ICD-10-CM | POA: Insufficient documentation

## 2022-01-27 DIAGNOSIS — G4489 Other headache syndrome: Secondary | ICD-10-CM | POA: Diagnosis not present

## 2022-01-27 DIAGNOSIS — R42 Dizziness and giddiness: Secondary | ICD-10-CM | POA: Diagnosis not present

## 2022-01-27 LAB — CBC WITH DIFFERENTIAL/PLATELET
Abs Immature Granulocytes: 0.02 10*3/uL (ref 0.00–0.07)
Basophils Absolute: 0.1 10*3/uL (ref 0.0–0.1)
Basophils Relative: 1 %
Eosinophils Absolute: 0.3 10*3/uL (ref 0.0–0.5)
Eosinophils Relative: 4 %
HCT: 38.1 % — ABNORMAL LOW (ref 39.0–52.0)
Hemoglobin: 12.8 g/dL — ABNORMAL LOW (ref 13.0–17.0)
Immature Granulocytes: 0 %
Lymphocytes Relative: 40 %
Lymphs Abs: 2.5 10*3/uL (ref 0.7–4.0)
MCH: 30.7 pg (ref 26.0–34.0)
MCHC: 33.6 g/dL (ref 30.0–36.0)
MCV: 91.4 fL (ref 80.0–100.0)
Monocytes Absolute: 0.5 10*3/uL (ref 0.1–1.0)
Monocytes Relative: 8 %
Neutro Abs: 3 10*3/uL (ref 1.7–7.7)
Neutrophils Relative %: 47 %
Platelets: 239 10*3/uL (ref 150–400)
RBC: 4.17 MIL/uL — ABNORMAL LOW (ref 4.22–5.81)
RDW: 13.1 % (ref 11.5–15.5)
WBC: 6.4 10*3/uL (ref 4.0–10.5)
nRBC: 0 % (ref 0.0–0.2)

## 2022-01-27 LAB — COMPREHENSIVE METABOLIC PANEL
ALT: 12 U/L (ref 0–44)
AST: 18 U/L (ref 15–41)
Albumin: 3.7 g/dL (ref 3.5–5.0)
Alkaline Phosphatase: 40 U/L (ref 38–126)
Anion gap: 5 (ref 5–15)
BUN: 12 mg/dL (ref 6–20)
CO2: 22 mmol/L (ref 22–32)
Calcium: 8.1 mg/dL — ABNORMAL LOW (ref 8.9–10.3)
Chloride: 112 mmol/L — ABNORMAL HIGH (ref 98–111)
Creatinine, Ser: 0.91 mg/dL (ref 0.61–1.24)
GFR, Estimated: >60 mL/min (ref >60)
Glucose, Bld: 84 mg/dL (ref 70–99)
Potassium: 3.9 mmol/L (ref 3.5–5.1)
Sodium: 139 mmol/L (ref 135–145)
Total Bilirubin: 0.4 mg/dL (ref 0.3–1.2)
Total Protein: 5.9 g/dL — ABNORMAL LOW (ref 6.5–8.1)

## 2022-01-27 LAB — CBG MONITORING, ED: Glucose-Capillary: 91 mg/dL (ref 70–99)

## 2022-01-27 MED ORDER — LACTATED RINGERS IV BOLUS
2000.0000 mL | Freq: Once | INTRAVENOUS | Status: AC
  Administered 2022-01-27: 2000 mL via INTRAVENOUS

## 2022-01-27 MED ORDER — METOCLOPRAMIDE HCL 5 MG/ML IJ SOLN
5.0000 mg | Freq: Once | INTRAMUSCULAR | Status: AC
  Administered 2022-01-27: 5 mg via INTRAVENOUS
  Filled 2022-01-27: qty 2

## 2022-01-27 MED ORDER — LACTATED RINGERS IV SOLN: INTRAVENOUS | Status: DC

## 2022-01-27 MED ORDER — DIPHENHYDRAMINE HCL 50 MG/ML IJ SOLN
12.5000 mg | Freq: Once | INTRAMUSCULAR | Status: AC
  Administered 2022-01-27: 12.5 mg via INTRAVENOUS
  Filled 2022-01-27: qty 1

## 2022-01-27 NOTE — ED Triage Notes (Signed)
Pt came to the ED via EMS for a fall from standing out of his truck at work. Witness on the scene reported patient hit his head on concrete when he fell. Patient in C Collar.  A&O X4.

## 2022-01-27 NOTE — ED Provider Notes (Signed)
[facility] EMERGENCY DEPARTMENT Provider Note   CSN: [id number] Arrival date & time: 01/27/22  1003     History  Chief Complaint  Patient presents with   Brian Hess is a 41 y.o. male.  41 year old male presents after having a syncopal event while in his [employer] truck.  Patient felt dizzy and lightheaded and then fell out of his truck.  States he not been feeling well today.  Has been experiencing a headache similar to his prior migraine.  Has also had decreased oral intake.  Denies any emesis or diarrhea.  No recent alcohol use.  Did smoke marijuana yesterday evening.  EMS was called and patient found be hypotensive and given IV fluids and transported here.  Was also placed in c-collar.  Patient does note slight neck pain but no different than his baseline       Home Medications Prior to Admission medications   Medication Sig Start Date End Date Taking? Authorizing Provider  sertraline (ZOLOFT) 100 MG tablet TAKE 1 TABLET(100 MG) BY MOUTH DAILY 06/04/21   Jeanie Sewer, NP      Allergies    Bee venom, Iodine, and Nsaids    Review of Systems   Review of Systems  All other systems reviewed and are negative.   Physical Exam Updated Vital Signs BP 109/70 (BP Location: Right Arm)   Pulse 64   Temp 98.8 F (37.1 C) (Oral)   Resp 18   SpO2 100%  Physical Exam Vitals and nursing note reviewed.  Constitutional:      General: He is not in acute distress.    Appearance: Normal appearance. He is well-developed. He is not toxic-appearing.  HENT:     Head: Normocephalic and atraumatic.  Eyes:     General: Lids are normal.     Conjunctiva/sclera: Conjunctivae normal.     Pupils: Pupils are equal, round, and reactive to light.  Neck:     Thyroid: No thyroid mass.     Trachea: No tracheal deviation.  Cardiovascular:     Rate and Rhythm: Normal rate and regular rhythm.     Heart sounds: Normal heart sounds. No murmur heard.    No gallop.   Pulmonary:     Effort: Pulmonary effort is normal. No respiratory distress.     Breath sounds: Normal breath sounds. No stridor. No decreased breath sounds, wheezing, rhonchi or rales.  Abdominal:     General: There is no distension.     Palpations: Abdomen is soft.     Tenderness: There is no abdominal tenderness. There is no rebound.  Musculoskeletal:        General: No tenderness. Normal range of motion.     Cervical back: Normal range of motion and neck supple. Muscular tenderness present. No spinous process tenderness.  Skin:    General: Skin is warm and dry.     Findings: No abrasion or rash.  Neurological:     General: No focal deficit present.     Mental Status: He is alert and oriented to person, place, and time. Mental status is at baseline.     GCS: GCS eye subscore is 4. GCS verbal subscore is 5. GCS motor subscore is 6.     Cranial Nerves: No cranial nerve deficit.     Sensory: No sensory deficit.     Motor: Motor function is intact.     Comments: 5 of 5 in upper as well as lower extremities  Psychiatric:        Attention and Perception: Attention normal.        Speech: Speech normal.        Behavior: Behavior normal.     ED Results / Procedures / Treatments   Labs (all labs ordered are listed, but only abnormal results are displayed) Labs Reviewed  CBC WITH DIFFERENTIAL/PLATELET  COMPREHENSIVE METABOLIC PANEL  CBG MONITORING, ED    EKG EKG Interpretation  Date/Time:  Thursday January 27 2022 10:21:14 EDT Ventricular Rate:  61 PR Interval:  151 QRS Duration: 96 QT Interval:  439 QTC Calculation: 443 R Axis:   88 Text Interpretation: Sinus rhythm RSR' in V1 or V2, probably normal variant No significant change since last tracing Confirmed by Lacretia Leigh ([id number]) on 01/27/2022 10:24:56 AM  Radiology No results found.  Procedures Procedures    Medications Ordered in ED Medications  lactated ringers bolus 2,000 mL (has no administration in time  range)  lactated ringers infusion (has no administration in time range)  metoCLOPramide (REGLAN) injection 5 mg (has no administration in time range)  diphenhydrAMINE (BENADRYL) injection 12.5 mg (has no administration in time range)    ED Course/ Medical Decision Making/ A&P                           Medical Decision Making Amount and/or Complexity of Data Reviewed Labs: ordered. Radiology: ordered.  Risk Prescription drug management.   Patient's EKG per interpretation shows normal sinus rhythm.  No signs of acute ischemic changes.  Patient with migraine headache myalgias treated with migraine cocktail feels much better at this time.  He has a history of trauma, patient had a CT of his head neck which per my review and interpretation did not show any acute findings.  He has remained neurologically stable at this time.  Patient likely with vasovagal episode.  I do not think this was a cardiac dysrhythmia.  Patient reevaluated and headache has resolved.  He did present with a migraine for several days.  His wife is at the bedside and states that the patient is at his baseline.  To be discharged and return precautions given        Final Clinical Impression(s) / ED Diagnoses Final diagnoses:  None    Rx / DC Orders ED Discharge Orders     None         Lacretia Leigh, MD 01/27/22 1329

## 2022-01-27 NOTE — ED Notes (Signed)
Pt was discharge went over the AVS with the patient and went over his diagnoses. Patient understand and did not have any questions or concerns.

## 2023-02-01 ENCOUNTER — Ambulatory Visit: Payer: BC Managed Care – PPO | Admitting: Family

## 2023-02-01 NOTE — Progress Notes (Deleted)
   Patient ID: Brian Hess, male    DOB: 03-Sep-1980, 42 y.o.   MRN: [id number]  No chief complaint on file.   HPI: Possible abdominal bruit:    Assessment & Plan:  There are no diagnoses linked to this encounter.  Subjective:    Outpatient Medications Prior to Visit  Medication Sig Dispense Refill   aspirin-acetaminophen-caffeine (EXCEDRIN MIGRAINE) 250-250-65 MG tablet Take 3 tablets by mouth 2 (two) times daily as needed for headache.     sertraline (ZOLOFT) 100 MG tablet TAKE 1 TABLET(100 MG) BY MOUTH DAILY (Patient not taking: Reported on 01/27/2022) 90 tablet 1   No facility-administered medications prior to visit.   Past Medical History:  Diagnosis Date   Back pain    Encounter for health maintenance examination in adult 03/06/2020   Encounter for health maintenance examination in adult 03/06/2020   Headache    High risk medication use 06/18/2020   Hypotension 06/18/2020   IBS (irritable bowel syndrome)    Leg numbness 03/06/2020   Mood change 02/2020   doing fine on Setraline, but in years prior several prior medications   Muscle spasms of neck 10/06/2020   Seizures ([facility]) 06/07/2019   ? questionable   Syncope 07/08/2020   Torticollis 10/06/2020   Vaccine counseling 03/06/2020   Past Surgical History:  Procedure Laterality Date   COLONOSCOPY  2019   blood in stool; 1 polyp removed   SHOULDER SURGERY  1999   right   Allergies  Allergen Reactions   Bee Venom Anaphylaxis   Iodine Anaphylaxis   Nsaids Other (See Comments)    Avoids due to GI upset, IBS       Objective:    Physical Exam Vitals and nursing note reviewed.  Constitutional:      General: He is not in acute distress.    Appearance: Normal appearance.  HENT:     Head: Normocephalic.  Cardiovascular:     Rate and Rhythm: Normal rate and regular rhythm.  Pulmonary:     Effort: Pulmonary effort is normal.     Breath sounds: Normal breath sounds.  Musculoskeletal:        General: Normal range  of motion.     Cervical back: Normal range of motion.  Skin:    General: Skin is warm and dry.  Neurological:     Mental Status: He is alert and oriented to person, place, and time.  Psychiatric:        Mood and Affect: Mood normal.    There were no vitals taken for this visit. Wt Readings from Last 3 Encounters:  01/27/22 160 lb (72.6 kg)  06/04/21 151 lb 9.6 oz (68.8 kg)  10/08/20 158 lb 9.6 oz (71.9 kg)       Dulce Sellar, NP

## 2023-02-02 ENCOUNTER — Ambulatory Visit: Payer: BC Managed Care – PPO | Admitting: Family

## 2023-02-02 NOTE — Progress Notes (Deleted)
   Patient ID: Brian Hess, male    DOB: 03-Sep-1980, 42 y.o.   MRN: [id number]  No chief complaint on file.   HPI: Possible abdominal bruit:    Assessment & Plan:  There are no diagnoses linked to this encounter.  Subjective:    Outpatient Medications Prior to Visit  Medication Sig Dispense Refill   aspirin-acetaminophen-caffeine (EXCEDRIN MIGRAINE) 250-250-65 MG tablet Take 3 tablets by mouth 2 (two) times daily as needed for headache.     sertraline (ZOLOFT) 100 MG tablet TAKE 1 TABLET(100 MG) BY MOUTH DAILY (Patient not taking: Reported on 01/27/2022) 90 tablet 1   No facility-administered medications prior to visit.   Past Medical History:  Diagnosis Date   Back pain    Encounter for health maintenance examination in adult 03/06/2020   Encounter for health maintenance examination in adult 03/06/2020   Headache    High risk medication use 06/18/2020   Hypotension 06/18/2020   IBS (irritable bowel syndrome)    Leg numbness 03/06/2020   Mood change 02/2020   doing fine on Setraline, but in years prior several prior medications   Muscle spasms of neck 10/06/2020   Seizures ([facility]) 06/07/2019   ? questionable   Syncope 07/08/2020   Torticollis 10/06/2020   Vaccine counseling 03/06/2020   Past Surgical History:  Procedure Laterality Date   COLONOSCOPY  2019   blood in stool; 1 polyp removed   SHOULDER SURGERY  1999   right   Allergies  Allergen Reactions   Bee Venom Anaphylaxis   Iodine Anaphylaxis   Nsaids Other (See Comments)    Avoids due to GI upset, IBS       Objective:    Physical Exam Vitals and nursing note reviewed.  Constitutional:      General: He is not in acute distress.    Appearance: Normal appearance.  HENT:     Head: Normocephalic.  Cardiovascular:     Rate and Rhythm: Normal rate and regular rhythm.  Pulmonary:     Effort: Pulmonary effort is normal.     Breath sounds: Normal breath sounds.  Musculoskeletal:        General: Normal range  of motion.     Cervical back: Normal range of motion.  Skin:    General: Skin is warm and dry.  Neurological:     Mental Status: He is alert and oriented to person, place, and time.  Psychiatric:        Mood and Affect: Mood normal.    There were no vitals taken for this visit. Wt Readings from Last 3 Encounters:  01/27/22 160 lb (72.6 kg)  06/04/21 151 lb 9.6 oz (68.8 kg)  10/08/20 158 lb 9.6 oz (71.9 kg)       Dulce Sellar, NP

## 2023-02-03 ENCOUNTER — Encounter: Payer: Self-pay | Admitting: Family

## 2023-02-03 ENCOUNTER — Ambulatory Visit (INDEPENDENT_AMBULATORY_CARE_PROVIDER_SITE_OTHER): Payer: BC Managed Care – PPO | Admitting: Family

## 2023-02-03 VITALS — BP 103/67 | HR 74 | Temp 98.0°F | Ht 71.0 in | Wt 147.2 lb

## 2023-02-03 DIAGNOSIS — F17200 Nicotine dependence, unspecified, uncomplicated: Secondary | ICD-10-CM | POA: Diagnosis not present

## 2023-02-03 DIAGNOSIS — R0989 Other specified symptoms and signs involving the circulatory and respiratory systems: Secondary | ICD-10-CM | POA: Diagnosis not present

## 2023-02-03 NOTE — Progress Notes (Unsigned)
Patient ID: Brian Hess, male    DOB: 1980-11-23, 42 y.o.   MRN: [id number]  Chief Complaint  Patient presents with  . Follow-up    Seen at [facility] physical last Tuesday, Doctor stated he heard what he believed to be an abdominal bruit.     HPI: Possible abdominal bruit:  Smoker:  pt reports smoking 1/2ppd and can go without smoking at all for a day or 2. Started smoking at age 43, then has stopped for a year or so, but last 29yrs has been non-stop.  Assessment & Plan:  There are no diagnoses linked to this encounter.  Subjective:    Outpatient Medications Prior to Visit  Medication Sig Dispense Refill  . aspirin-acetaminophen-caffeine (EXCEDRIN MIGRAINE) 250-250-65 MG tablet Take 3 tablets by mouth 2 (two) times daily as needed for headache.    . sertraline (ZOLOFT) 100 MG tablet TAKE 1 TABLET(100 MG) BY MOUTH DAILY (Patient not taking: Reported on 01/27/2022) 90 tablet 1   No facility-administered medications prior to visit.   Past Medical History:  Diagnosis Date  . Back pain   . Encounter for health maintenance examination in adult 03/06/2020  . Encounter for health maintenance examination in adult 03/06/2020  . Headache   . High risk medication use 06/18/2020  . Hypotension 06/18/2020  . IBS (irritable bowel syndrome)   . Leg numbness 03/06/2020  . Mood change 02/2020   doing fine on Setraline, but in years prior several prior medications  . Muscle spasms of neck 10/06/2020  . Seizures ([facility]) 06/07/2019   ? questionable  . Syncope 07/08/2020  . Torticollis 10/06/2020  . Vaccine counseling 03/06/2020   Past Surgical History:  Procedure Laterality Date  . COLONOSCOPY  2019   blood in stool; 1 polyp removed  . SHOULDER SURGERY  1999   right   Allergies  Allergen Reactions  . Bee Venom Anaphylaxis  . Iodine Anaphylaxis  . Nsaids Other (See Comments)    Avoids due to GI upset, IBS       Objective:    Physical Exam Vitals and nursing note reviewed.   Constitutional:      General: He is not in acute distress.    Appearance: Normal appearance.  HENT:     Head: Normocephalic.  Cardiovascular:     Rate and Rhythm: Normal rate and regular rhythm.  Pulmonary:     Effort: Pulmonary effort is normal.     Breath sounds: Normal breath sounds.  Musculoskeletal:        General: Normal range of motion.     Cervical back: Normal range of motion.  Skin:    General: Skin is warm and dry.  Neurological:     Mental Status: He is alert and oriented to person, place, and time.  Psychiatric:        Mood and Affect: Mood normal.   BP 103/67 (BP Location: Left Arm, Patient Position: Sitting, Cuff Size: Normal)   Pulse (!) 7   Temp 98 F (36.7 C) (Temporal)   Ht 5\' 11"  (1.803 m)   Wt 147 lb 3.2 oz (66.8 kg)   SpO2 97%   BMI 20.53 kg/m  Wt Readings from Last 3 Encounters:  02/03/23 147 lb 3.2 oz (66.8 kg)  01/27/22 160 lb (72.6 kg)  06/04/21 151 lb 9.6 oz (68.8 kg)      Dulce Sellar, NP

## 2023-02-07 ENCOUNTER — Ambulatory Visit
Admission: RE | Admit: 2023-02-07 | Discharge: 2023-02-07 | Disposition: A | Discharge status: Home / Self Care | Payer: BC Managed Care – PPO | Source: Ambulatory Visit | Attending: Family | Admitting: Family

## 2023-02-07 DIAGNOSIS — R0989 Other specified symptoms and signs involving the circulatory and respiratory systems: Secondary | ICD-10-CM

## 2023-02-07 DIAGNOSIS — F329 Major depressive disorder, single episode, unspecified: Secondary | ICD-10-CM | POA: Diagnosis not present

## 2023-02-07 DIAGNOSIS — M4802 Spinal stenosis, cervical region: Secondary | ICD-10-CM | POA: Diagnosis not present

## 2023-02-13 ENCOUNTER — Encounter: Payer: Self-pay | Admitting: Family

## 2023-02-15 ENCOUNTER — Ambulatory Visit: Payer: BC Managed Care – PPO | Admitting: Family

## 2023-02-15 ENCOUNTER — Encounter: Payer: Self-pay | Admitting: Family

## 2023-02-15 VITALS — BP 105/71 | HR 70 | Temp 98.0°F | Ht 71.0 in | Wt 146.1 lb

## 2023-02-15 DIAGNOSIS — Z0000 Encounter for general adult medical examination without abnormal findings: Secondary | ICD-10-CM | POA: Diagnosis not present

## 2023-02-15 DIAGNOSIS — F17200 Nicotine dependence, unspecified, uncomplicated: Secondary | ICD-10-CM | POA: Diagnosis not present

## 2023-02-15 DIAGNOSIS — Z1159 Encounter for screening for other viral diseases: Secondary | ICD-10-CM | POA: Diagnosis not present

## 2023-02-15 LAB — CBC WITH DIFFERENTIAL/PLATELET
Basophils Absolute: 0.1 10*3/uL (ref 0.0–0.1)
Basophils Relative: 0.9 % (ref 0.0–3.0)
Eosinophils Absolute: 0.4 10*3/uL (ref 0.0–0.7)
Eosinophils Relative: 4.8 % (ref 0.0–5.0)
HCT: 44.5 % (ref 39.0–52.0)
Hemoglobin: 14.6 g/dL (ref 13.0–17.0)
Lymphocytes Relative: 36.1 % (ref 12.0–46.0)
Lymphs Abs: 3.1 10*3/uL (ref 0.7–4.0)
MCHC: 32.8 g/dL (ref 30.0–36.0)
MCV: 91.4 fl (ref 78.0–100.0)
Monocytes Absolute: 0.5 10*3/uL (ref 0.1–1.0)
Monocytes Relative: 5.3 % (ref 3.0–12.0)
Neutro Abs: 4.6 10*3/uL (ref 1.4–7.7)
Neutrophils Relative %: 52.9 % (ref 43.0–77.0)
Platelets: 247 10*3/uL (ref 150.0–400.0)
RBC: 4.87 Mil/uL (ref 4.22–5.81)
RDW: 13.6 % (ref 11.5–15.5)
WBC: 8.6 10*3/uL (ref 4.0–10.5)

## 2023-02-15 LAB — COMPREHENSIVE METABOLIC PANEL
ALT: 11 U/L (ref 0–53)
AST: 17 U/L (ref 0–37)
Albumin: 4.5 g/dL (ref 3.5–5.2)
Alkaline Phosphatase: 48 U/L (ref 39–117)
BUN: 9 mg/dL (ref 6–23)
CO2: 25 mEq/L (ref 19–32)
Calcium: 9.5 mg/dL (ref 8.4–10.5)
Chloride: 108 mEq/L (ref 96–112)
Creatinine, Ser: 0.91 mg/dL (ref 0.40–1.50)
GFR: 103.01 mL/min (ref >60.00)
Glucose, Bld: 90 mg/dL (ref 70–99)
Potassium: 3.9 mEq/L (ref 3.5–5.1)
Sodium: 141 mEq/L (ref 135–145)
Total Bilirubin: 0.4 mg/dL (ref 0.2–1.2)
Total Protein: 7.1 g/dL (ref 6.0–8.3)

## 2023-02-15 LAB — LIPID PANEL
Cholesterol: 182 mg/dL (ref 0–200)
HDL: 38.3 mg/dL — ABNORMAL LOW (ref >39.00)
LDL Cholesterol: 118 mg/dL — ABNORMAL HIGH (ref 0–99)
NonHDL: 143.5
Total CHOL/HDL Ratio: 5
Triglycerides: 126 mg/dL (ref 0.0–149.0)
VLDL: 25.2 mg/dL (ref 0.0–40.0)

## 2023-02-15 LAB — TSH: TSH: 1.89 u[IU]/mL (ref 0.35–5.50)

## 2023-02-15 NOTE — Progress Notes (Signed)
Phone: [phone]  Subjective:  Patient 42 y.o. male presenting for annual physical.  Chief Complaint  Patient presents with   Annual Exam    Fasting w/ labs    See problem oriented charting- ROS- full  review of systems was completed and negative.   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 questionable   Syncope 07/08/2020   Syncope and collapse 06/18/2020   Torticollis 10/06/2020   Upper back pain 10/06/2020   Vaccine counseling 03/06/2020   Patient Active Problem List   Diagnosis Date Noted   Anxiety and depression 06/04/2021   Cervical spinal stenosis 10/08/2020   Smoker 03/06/2020   Past Surgical History:  Procedure Laterality Date   COLONOSCOPY  2019   blood in stool; 1 polyp removed   SHOULDER SURGERY  1999   right    Family History  Problem Relation Age of Onset   Diabetes Mother    Depression Mother    Cancer Maternal Grandfather        not sure what king   Diabetes Maternal Grandfather    Depression Father    Other Father        electrocution, brain damage and finger amputation   Diabetes Sister    Diabetes Maternal Aunt    Heart disease Maternal Grandmother        pacemaker   Cancer Paternal Grandmother        breast    Heart disease Paternal Grandfather    Colon polyps Neg Hx    Esophageal cancer Neg Hx    Pancreatic cancer Neg Hx    Rectal cancer Neg Hx    Stomach cancer Neg Hx     Medications- reviewed and updated Current Outpatient Medications  Medication Sig Dispense Refill   aspirin-acetaminophen-caffeine (EXCEDRIN MIGRAINE) 250-250-65 MG tablet Take 3 tablets by mouth 2 (two) times daily as needed for headache.     No current facility-administered medications for this visit.    Allergies-reviewed and updated Allergies  Allergen Reactions   Bee Venom Anaphylaxis   Iodine Anaphylaxis   Nsaids Other (See Comments)    Avoids due to GI upset, IBS     Social History   Social History Narrative   Lives with wife and 2 kids.     Right Handed   Drinks 5-7 cups caffeine daily    Objective:  BP 105/71 (BP Location: Left Arm, Patient Position: Sitting, Cuff Size: Large)   Pulse 70   Temp 98 F (36.7 C) (Temporal)   Ht 5\' 11"  (1.803 m)   Wt 146 lb 0.8 oz (66.2 kg)   SpO2 98%   BMI 20.37 kg/m  Physical Exam Vitals and nursing note reviewed.  Constitutional:      General:  He is not in acute distress.    Appearance: Normal appearance.  HENT:     Head: Normocephalic.     Right Ear: Tympanic membrane and external ear normal.     Left Ear: Tympanic membrane and external ear normal.     Nose: Nose normal.     Mouth/Throat:     Mouth: Mucous membranes are moist.  Eyes:     Extraocular Movements: Extraocular movements intact.  Cardiovascular:     Rate and Rhythm: Normal rate and regular rhythm.  Pulmonary:     Effort: Pulmonary effort is normal.     Breath sounds: Normal breath sounds.  Abdominal:     General: Abdomen is flat. There is no distension.     Palpations: Abdomen is soft.     Tenderness: There is no abdominal tenderness.  Musculoskeletal:        General: Normal range of motion.     Cervical back: Normal range of motion.  Skin:    General: Skin is warm and dry.   Neurological:     Mental Status: He is alert and oriented to person, place, and time.  Psychiatric:        Mood and Affect: Mood normal.        Behavior: Behavior normal.        Judgment: Judgment normal.      Assessment and Plan   Health Maintenance counseling: 1. Anticipatory guidance: Patient counseled regarding regular dental exams q6 months, eye exams yearly, avoiding smoking and second hand smoke, limiting alcohol to 2 beverages per day.   2. Risk factor reduction:  Advised patient of need for regular exercise and diet rich in fruits and vegetables to reduce risk of heart attack and stroke.    Wt Readings from Last 3 Encounters:  02/15/23 146 lb 0.8 oz (66.2 kg)  02/03/23 147 lb 3.2 oz (66.8 kg)  01/27/22 160 lb (72.6 kg)   3. Immunizations/screenings/ancillary studies Immunization History  Administered Date(s) Administered   Janssen (J&J) SARS-COV-2 Vaccination 08/30/2019   Health Maintenance Due  Topic Date Due   HIV Screening  Never done   Hepatitis C Screening  Never done    4. Skin cancer screening-  advised regular sunscreen use. Denies worrisome, changing, or new skin lesions.  5. Smoking associated screening:   smoker -1/2 ppd 6. STD screening - denies need today 7. Alcohol screening: rare 8. Colon cancer screening:  next colonoscopy due 2027  Annual physical exam -     HIV Antibody (routine testing w rflx) -     TSH -     Lipid panel -     CBC with Differential/Platelet -     Comprehensive metabolic panel  Need for hepatitis C screening test -     Hepatitis C antibody   Recommended follow up: No follow-ups on file. No future appointments.   Lab/Order associations:  fasting   Dulce Sellar, NP

## 2023-02-15 NOTE — Patient Instructions (Signed)
It was very nice to see you today!   I will review your lab results via MyChart in a few days.   Have a great week!    PLEASE NOTE:  If you had any lab tests please let us know if you have not heard back within a few days. You may see your results on MyChart before we have a chance to review them but we will give you a call once they are reviewed by [place]. If we ordered any referrals today, please let us know if you have not heard from their office within the next week.

## 2023-02-16 LAB — HEPATITIS C ANTIBODY: Hepatitis C Ab: NONREACTIVE

## 2023-02-16 LAB — HIV ANTIBODY (ROUTINE TESTING W REFLEX): HIV 1&2 Ab, 4th Generation: NONREACTIVE

## 2023-02-16 NOTE — Telephone Encounter (Signed)
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

## 2023-02-17 ENCOUNTER — Encounter: Payer: Self-pay | Admitting: Family

## 2023-02-21 ENCOUNTER — Encounter: Payer: Self-pay | Admitting: Family

## 2024-02-16 ENCOUNTER — Ambulatory Visit: Admitting: Family

## 2024-02-16 ENCOUNTER — Encounter: Payer: Self-pay | Admitting: Family

## 2024-02-16 VITALS — BP 95/62 | HR 68 | Temp 97.9°F | Ht 71.0 in

## 2024-02-16 DIAGNOSIS — Z0000 Encounter for general adult medical examination without abnormal findings: Secondary | ICD-10-CM

## 2024-02-16 DIAGNOSIS — E7800 Pure hypercholesterolemia, unspecified: Secondary | ICD-10-CM | POA: Diagnosis not present

## 2024-02-16 LAB — CBC WITH DIFFERENTIAL/PLATELET
Absolute Lymphocytes: 3341 {cells}/uL (ref 850–3900)
Absolute Monocytes: 518 {cells}/uL (ref 200–950)
Basophils Absolute: 67 {cells}/uL (ref 0–200)
Basophils Relative: 0.7 %
Eosinophils Absolute: 173 {cells}/uL (ref 15–500)
Eosinophils Relative: 1.8 %
HCT: 40.8 % (ref 38.5–50.0)
Hemoglobin: 13.6 g/dL (ref 13.2–17.1)
MCH: 31.2 pg (ref 27.0–33.0)
MCHC: 33.3 g/dL (ref 32.0–36.0)
MCV: 93.6 fL (ref 80.0–100.0)
MPV: 10.8 fL (ref 7.5–12.5)
Monocytes Relative: 5.4 %
Neutro Abs: 5501 {cells}/uL (ref 1500–7800)
Neutrophils Relative %: 57.3 %
Platelets: 268 Thousand/uL (ref 140–400)
RBC: 4.36 Million/uL (ref 4.20–5.80)
RDW: 12.5 % (ref 11.0–15.0)
Total Lymphocyte: 34.8 %
WBC: 9.6 Thousand/uL (ref 3.8–10.8)

## 2024-02-16 LAB — LIPID PANEL
Cholesterol: 177 mg/dL (ref <200)
HDL: 42 mg/dL (ref >=40)
LDL Cholesterol (Calc): 107 mg/dL — ABNORMAL HIGH
Non-HDL Cholesterol (Calc): 135 mg/dL — ABNORMAL HIGH (ref <130)
Total CHOL/HDL Ratio: 4.2 (calc) (ref <5.0)
Triglycerides: 164 mg/dL — ABNORMAL HIGH (ref <150)

## 2024-02-16 LAB — COMPREHENSIVE METABOLIC PANEL WITH GFR
AG Ratio: 2.4 (calc) (ref 1.0–2.5)
ALT: 10 U/L (ref 9–46)
AST: 17 U/L (ref 10–40)
Albumin: 4.7 g/dL (ref 3.6–5.1)
Alkaline phosphatase (APISO): 49 U/L (ref 36–130)
BUN: 13 mg/dL (ref 7–25)
CO2: 26 mmol/L (ref 20–32)
Calcium: 9.4 mg/dL (ref 8.6–10.3)
Chloride: 104 mmol/L (ref 98–110)
Creat: 0.82 mg/dL (ref 0.60–1.29)
Globulin: 2 g/dL (ref 1.9–3.7)
Glucose, Bld: 81 mg/dL (ref 65–99)
Potassium: 4.3 mmol/L (ref 3.5–5.3)
Sodium: 139 mmol/L (ref 135–146)
Total Bilirubin: 0.3 mg/dL (ref 0.2–1.2)
Total Protein: 6.7 g/dL (ref 6.1–8.1)
eGFR: 111 mL/min/1.73m2 (ref >=60)

## 2024-02-16 NOTE — Progress Notes (Signed)
 Phone: [phone]  Subjective:  Patient 43 y.o. male presenting for annual physical.  Chief Complaint  Patient presents with   Annual Exam    Non fasting w/ labs   finger problem  Discussed the use of AI scribe software for clinical note transcription with the patient, who gave verbal consent to proceed.  History of Present Illness   Brian Hess is a 43 year old male who presents for a physical and lab work.  He has improved his diet over the past year by reducing fried foods and choosing organic meats. He has also cut back on high-fat dairy products despite a strong preference for cheese. Previously, his cholesterol levels were slightly elevated, particularly LDL cholesterol.  He quit smoking on February 07, 2024, after previously smoking at least a pack a day. He switched to an electronic nicotine device for 2 weeks, then stopped this as well. He experiences no significant cravings. He notes a financial benefit from quitting and plans to start hiking with his wife.  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 His weight was approximately 155-160 pounds when he began the job.  His bowel movements are regular, occurring every morning at 5:30 AM. He drinks water regularly, alternating with Gatorade, especially in hot conditions, and acknowledges the need to increase water intake to reduce sugar consumption.  His sleep has been disrupted due to his work schedule, which involves overnight driving to [place] . He plans to nap before his shift to manage his sleep better.     See problem oriented charting- ROS- full  review of systems was completed and negative except for what's in HPI above.   The following were reviewed and entered/updated in epic: Past Medical History:  Diagnosis Date   Back pain    Chronic low back pain 03/06/2020   Decreased ROM of neck 10/06/2020    Depression, major, in remission 03/06/2020   Dizziness 07/08/2020   Encounter for health maintenance examination in adult 03/06/2020   Encounter for health maintenance examination in adult 03/06/2020   Headache    High risk medication use 06/18/2020   Hypotension 06/18/2020   IBS (irritable bowel syndrome)    Leg numbness 03/06/2020   Mood change 02/2020   doing fine on Setraline, but in years prior several prior medications   Muscle spasms of neck 10/06/2020   Screening for lipid disorders 03/06/2020   Seizure-like activity (HCC) 06/18/2020   Seizures (HCC) 06/07/2019   ? questionable   Syncope 07/08/2020   Syncope and collapse 06/18/2020   Torticollis 10/06/2020   Upper back pain 10/06/2020   Vaccine counseling 03/06/2020   Patient Active Problem List   Diagnosis Date Noted   Anxiety and depression 06/04/2021   Cervical spinal stenosis 10/08/2020   Smoker 03/06/2020   Past Surgical History:  Procedure Laterality Date   COLONOSCOPY  2019   blood in stool; 1 polyp removed   SHOULDER SURGERY  1999   right    Family History  Problem Relation Age of Onset   Diabetes Mother    Depression Mother    Cancer Maternal Grandfather        not sure what king   Diabetes Maternal Grandfather    Depression Father    Other Father        electrocution, brain damage and finger amputation   Diabetes Sister    Diabetes Maternal Aunt  Heart disease Maternal Grandmother        pacemaker   Cancer Paternal Grandmother        breast   Heart disease Paternal Grandfather    Colon polyps Neg Hx    Esophageal cancer Neg Hx    Pancreatic cancer Neg Hx    Rectal cancer Neg Hx    Stomach cancer Neg Hx     Medications- reviewed and updated Current Outpatient Medications  Medication Sig Dispense Refill   aspirin-acetaminophen-caffeine (EXCEDRIN MIGRAINE) 250-250-65 MG tablet Take 3 tablets by mouth 2 (two) times daily as needed for headache.     No current facility-administered  medications for this visit.    Allergies-reviewed and updated Allergies  Allergen Reactions   Bee Venom Anaphylaxis   Iodine Anaphylaxis   Nsaids Other (See Comments)    Avoids due to GI upset, IBS     Social History   Social History Narrative   Lives with wife and 2 kids.     Right Handed   Drinks 5-7 cups caffeine daily    Objective:  BP 95/62 (BP Location: Left Arm, Patient Position: Sitting, Cuff Size: Normal)   Pulse 68   Temp 97.9 F (36.6 C) (Temporal)   Ht 5' 11 (1.803 m)   SpO2 97%   BMI 20.37 kg/m  Physical Exam Vitals and nursing note reviewed.  Constitutional:      General: He is not in acute distress.    Appearance: Normal appearance.  HENT:     Head: Normocephalic.     Right Ear: Tympanic membrane and external ear normal.     Left Ear: Tympanic membrane and external ear normal.     Nose: Nose normal.     Mouth/Throat:     Mouth: Mucous membranes are moist.  Eyes:     Extraocular Movements: Extraocular movements intact.  Cardiovascular:     Rate and Rhythm: Normal rate and regular rhythm.  Pulmonary:     Effort: Pulmonary effort is normal.     Breath sounds: Normal breath sounds.  Abdominal:     General: Abdomen is flat. There is no distension.     Palpations: Abdomen is soft.     Tenderness: There is no abdominal tenderness.  Musculoskeletal:        General: Normal range of motion.     Cervical back: Normal range of motion.  Skin:    General: Skin is warm and dry.  Neurological:     Mental Status: He is alert and oriented to person, place, and time.  Psychiatric:        Mood and Affect: Mood normal.        Behavior: Behavior normal.        Judgment: Judgment normal.     Assessment and Plan   Health Maintenance counseling: 1. Anticipatory guidance: Patient counseled regarding regular dental exams q6 months, eye exams yearly, avoiding smoking and second hand smoke, limiting alcohol to 2 beverages per day.   2. Risk factor reduction:   Advised patient of need for regular exercise and diet rich in fruits and vegetables to reduce risk of heart attack and stroke.    Wt Readings from Last 3 Encounters:  02/15/23 146 lb 0.8 oz (66.2 kg)  02/03/23 147 lb 3.2 oz (66.8 kg)  01/27/22 160 lb (72.6 kg)   3. Immunizations/screenings/ancillary studies Immunization History  Administered Date(s) Administered   Janssen (J&J) SARS-COV-2 Vaccination 08/30/2019   There are no preventive care reminders to display for  this patient.   4. Skin cancer screening-  advised regular sunscreen use. Denies worrisome, changing, or new skin lesions.  5. Smoking associated screening:   quit last week! 6. STD screening - denies need today 7. Alcohol screening: rare 8. Colon cancer screening:  next colonoscopy due 2027  Assessment and Plan    Adult Wellness Visit Routine visit with slightly low blood pressure, likely due to lack of sleep. Recent dietary changes noted. - Perform full CPE lab panel. - Advised on healthy diet and daily cardio exercise.  Nicotine dependence, in remission Successfully quit using an electronic nicotine device. No significant cravings. Discussed potential weight gain and increased appetite post-cessation. - Encourage continued abstinence from smoking. - Support engagement in physical activities like hiking.     Recommended follow up: Return for any future concerns, Complete physical w/fasting labs. No future appointments.   Lab/Order associations:  fasting   Lucius Krabbe, NP

## 2024-02-16 NOTE — Patient Instructions (Signed)
 It was very nice to see you today!   I will review your lab results via MyChart in a few days.  You look great! Stay well! Congrats on no longer smoking!        PLEASE NOTE:  If you had any lab tests please let us  know if you have not heard back within a few days. You may see your results on MyChart before we have a chance to review them but we will give you a call once they are reviewed by us . If we ordered any referrals today, please let us  know if you have not heard from their office within the next week.

## 2024-02-17 ENCOUNTER — Encounter: Payer: Self-pay | Admitting: Family

## 2024-02-20 ENCOUNTER — Ambulatory Visit: Payer: Self-pay | Admitting: Family

## 2024-03-15 ENCOUNTER — Telehealth: Admitting: Physician Assistant

## 2024-03-15 DIAGNOSIS — J069 Acute upper respiratory infection, unspecified: Secondary | ICD-10-CM | POA: Diagnosis not present

## 2024-03-15 MED ORDER — FLUTICASONE PROPIONATE 50 MCG/ACT NA SUSP: 2.0000 | Freq: Every day | NASAL | 0 refills | Status: DC

## 2024-03-15 MED ORDER — LIDOCAINE VISCOUS HCL 2 % MT SOLN: 5.0000 mL | Freq: Four times a day (QID) | OROMUCOSAL | 0 refills | Status: DC | PRN

## 2024-03-15 MED ORDER — PSEUDOEPH-BROMPHEN-DM 30-2-10 MG/5ML PO SYRP: 5.0000 mL | ORAL_SOLUTION | Freq: Four times a day (QID) | ORAL | 0 refills | Status: DC | PRN

## 2024-03-15 NOTE — Progress Notes (Signed)

## 2024-05-02 ENCOUNTER — Encounter: Payer: Self-pay | Admitting: Family

## 2024-05-02 NOTE — Telephone Encounter (Signed)
 always let them know I do not do shots in the joint - needs to see ortho - he can try to walk in to our ortho clinic at [facility] before 5pm or he can go to [facility] after hours clinic today at 5 pm at [place] [facility].

## 2024-05-20 ENCOUNTER — Encounter: Payer: Self-pay | Admitting: Family

## 2024-05-20 ENCOUNTER — Ambulatory Visit: Payer: Self-pay

## 2024-05-20 NOTE — Telephone Encounter (Signed)
 if he has symptoms again prior to seeing me, he needs to go to the ED!

## 2024-05-20 NOTE — Telephone Encounter (Signed)
 FYI Only or Action Required?: FYI only for provider: appointment scheduled on 12/30.  Patient was last seen in primary care on 02/16/2024 by Lucius Krabbe, NP.  Called Nurse Triage reporting Loss of Consciousness.  Symptoms began yesterday.  Interventions attempted: Nothing.  Symptoms are: stable.  Triage Disposition: See HCP Within 4 Hours (Or PCP Triage)  Patient/caregiver understands and will follow disposition?: Yes           Copied from CRM [id number]. Topic: Clinical - Red Word Triage >> May 20, 2024 12:22 PM Anairis L wrote: [facility] that prompted transfer to Nurse Triage: Patient has been passing, out. Per clinic no app in two days, send to NT. Reason for Disposition  [1] All other patients AND [2] now alert and feels fine  (Exception: SIMPLE FAINT due to stress, pain, prolonged standing, or suddenly standing)  Answer Assessment - Initial Assessment Questions 1. ONSET: How long were you unconscious? (e.g., minutes, seconds) When did it happen?     Last night, patient fainted- while standing;was unconscious for a few seconds    2. CONTENT: What happened during the period of unconsciousness? (e.g., seizure activity)       Legs went stiff, landed on couch   3. MENTAL STATUS: Alert and oriented now? (e.g., oriented x 3 = name, month, location)       Oriented x 3    4. TRIGGER: What do you think caused the fainting? What were you doing just before you fainted?  (e.g., exercise, sudden standing up, prolonged standing)      Unsure, but had previous episode in 2021.   5. RECURRENT SYMPTOM: Have you ever passed out before? If Yes, ask: When was the last time? and What happened that time?      2021   6. INJURY: Did you hurt yourself when you fell?      No   7. CARDIAC SYMPTOMS: Have you had any of the following symptoms: chest pain, difficulty breathing, palpitations?     No   8. NEUROLOGIC SYMPTOMS: Have you had any of the following  symptoms: headache, numbness, vertigo, weakness?     Weakness during the episode, not currently; patient is at work   9. GI SYMPTOMS: Have you had any of the following symptoms: abdomen pain, vomiting, diarrhea, blood in stools?     No   10. OTHER SYMPTOMS: Do you have any other symptoms?       No     Patient called in to triage with complaints of fainting last night for a few seconds. This has been ongoing for one day, had one prior episode in 2021, then 2 times in the past 2 weeks.  The patient stated his legs went stiff and he fell on the couch.   Patient stated he is has ignored these symptoms since 2021; he stated that he is also afraid to miss work, and is requesting an appointment early in the AM. RN was able to schedule next day appt. At alternative office.   Appointment scheduled for further evaluation on 12/30; and agrees with the plan of care, and will reach out if symptoms worsen or persist.  Protocols used: Fainting-A-AH

## 2024-05-21 ENCOUNTER — Encounter: Payer: Self-pay | Admitting: Internal Medicine

## 2024-05-21 ENCOUNTER — Ambulatory Visit: Admitting: Internal Medicine

## 2024-05-21 VITALS — BP 120/66 | HR 77 | Temp 98.5°F | Ht 71.0 in | Wt 143.6 lb

## 2024-05-21 DIAGNOSIS — R55 Syncope and collapse: Secondary | ICD-10-CM

## 2024-05-21 DIAGNOSIS — R531 Weakness: Secondary | ICD-10-CM | POA: Diagnosis not present

## 2024-05-21 DIAGNOSIS — R200 Anesthesia of skin: Secondary | ICD-10-CM

## 2024-05-21 DIAGNOSIS — E039 Hypothyroidism, unspecified: Secondary | ICD-10-CM

## 2024-05-21 LAB — FOLATE: Folate: 12.6 ng/mL

## 2024-05-21 LAB — TSH: TSH: 5.61 u[IU]/mL — ABNORMAL HIGH (ref 0.35–5.50)

## 2024-05-21 LAB — VITAMIN B12: Vitamin B-12: 241 pg/mL (ref 211–911)

## 2024-05-21 NOTE — Patient Instructions (Signed)
Syncope, Adult  Syncope refers to a condition in which a person temporarily loses consciousness. Syncope may also be called fainting or passing out. It is caused by a sudden decrease in blood flow to the brain. This can happen for a variety of reasons. Most causes of syncope are not dangerous. It can be triggered by things such as needle sticks, seeing blood, pain, or intense emotion. However, syncope can also be a sign of a serious medical problem, such as a heart abnormality. Other causes can include dehydration, migraines, or taking medicines that lower blood pressure. Your health care provider may do tests to find the reason why you are having syncope. If you faint, get medical help right away. Call your local emergency services (911 in the [place] Follow these instructions at home: Pay attention to any changes in your symptoms. Take these actions to stay safe and to help relieve your symptoms: Knowing when you may be about to faint Signs that you may be about to faint include: Feeling dizzy, weak, light-headed, or like the room is spinning. Feeling nauseous. Seeing spots or seeing all white or all black in your field of vision. Having cold, clammy skin or feeling warm and sweaty. Hearing ringing in the ears (tinnitus). If you start to feel like you might faint, sit or lie down right away. If sitting, put your head down between your legs. If lying down, raise (elevate) your feet above the level of your heart. Breathe deeply and steadily. Wait until all the symptoms have passed. Have someone stay with you until you feel stable. Medicines Take over-the-counter and prescription medicines only as told by your health care provider. If you are taking blood pressure or heart medicine, get up slowly and take several minutes to sit and then stand. This can reduce dizziness and decrease the risk of syncope. Lifestyle Do not drive, use machinery, or play sports until your health care provider says it is  okay. Do not drink alcohol. Do not use any products that contain nicotine or tobacco. These products include cigarettes, chewing tobacco, and vaping devices, such as e-cigarettes. If you need help quitting, ask your health care provider. Avoid hot tubs and saunas. General instructions Talk with your health care provider about your symptoms. You may need to have testing to understand the cause of your syncope. Drink enough fluid to keep your urine pale yellow. Avoid prolonged standing. If you must stand for a long time, do movements such as: Moving your legs. Crossing your legs. Flexing and stretching your leg muscles. Squatting. Keep all follow-up visits. This is important. Contact a health care provider if: You have episodes of near fainting. Get help right away if: You faint. You hit your head or are injured after fainting. You have any of these symptoms that may indicate trouble with your heart: Fast or irregular heartbeats (palpitations). Unusual pain in your chest, abdomen, or back. Shortness of breath. You have a seizure. You have a severe headache. You are confused. You have vision problems. You have severe weakness or trouble walking. You are bleeding from your mouth or rectum, or you have black or tarry stool. These symptoms may represent a serious problem that is an emergency. Do not wait to see if your symptoms will go away. Get medical help right away. Call your local emergency services (911 in the [place] Do not drive yourself to the hospital. Summary Syncope refers to a condition in which a person temporarily loses consciousness. Syncope may also be called   fainting or passing out. It is caused by a sudden decrease in blood flow to the brain. Signs that you may be about to faint include dizziness, feeling light-headed, feeling nauseous, sudden vision changes, or cold, clammy skin. Even though most causes of syncope are not dangerous, syncope can be a sign of a serious  medical problem. Get help right away if you faint. If you start to feel like you might faint, sit or lie down right away. If sitting, put your head down between your legs. If lying down, raise (elevate) your feet above the level of your heart. This information is not intended to replace advice given to you by your health care provider. Make sure you discuss any questions you have with your health care provider. Document Revised: 09/17/2020 Document Reviewed: 09/17/2020 Elsevier Patient Education  2024 [employer]  

## 2024-05-21 NOTE — Progress Notes (Unsigned)
 "  Subjective:  Patient ID: Brian Hess, male    DOB: [birth year]  Age: 43 y.o. MRN: [id number]  CC: Loss of Consciousness (Patient states that he's been passing out and has passed out twice within the last month. His wife states that he passed in the kitchen but he doesn't remember that. The last passed time he passed out was Sunday night. His wife said that he was talking to her and their son, mid sentence he stopped talking and fell over the couch. She also states that he was grabbing his chest as If it was hurting but he doesn't remember this either. He didn't hit his head at all. )   HPI Brian Hess presents for f/up    Discussed the use of AI scribe software for clinical note transcription with the patient, who gave verbal consent to proceed.  History of Present Illness Brian Hess is a 43 year old male who presents with recurrent episodes of syncope.  Over the past two months, he has experienced recurrent episodes of syncope, often occurring suddenly when standing up. These episodes are accompanied by a sensation of his heart 'dropping out' and subsequent chest discomfort. His wife has observed him becoming stiff and falling over during these episodes, raising concerns for his safety, especially since he works outside alone. He recalls specific incidents in the kitchen and while talking to his son, with the most recent episode occurring on Sunday night.  He has a history of multiple concussions and a significant head injury in 2021, reportedly due to a seizure. He describes visual disturbances, such as his eyes 'going weird' before passing out, and has experienced similar symptoms at work, leading to a fall against a wall. Post-episode, he often feels dizzy, disconnected, and weak, with a recent episode of overall weakness and difficulty with fine motor tasks like fastening overalls.  He reports intermittent tingling in the fingertips of his left hand, with a sensation of reduced grip  strength, though he is right-hand dominant. He experiences dizziness when trying to focus on objects, but denies any significant vision changes. No chest pain, abdominal pain, or testicular pain reported by the patient. No coughing or wheezing.  He has experienced fluctuations in appetite, sometimes eating very little, and has lost approximately seven pounds over the last three months. He typically skips breakfast, eats lunch around noon, and has dinner, but notes a decreased appetite recently.  He quit smoking in September and does not consume alcohol regularly, with only occasional use of CBD and Delta 8/9 gummies for sleep, which he finds ineffective. He reports poor sleep quality, often waking up during the night, and rising early for work.     Outpatient Medications Prior to Visit  Medication Sig Dispense Refill   aspirin-acetaminophen-caffeine (EXCEDRIN MIGRAINE) 250-250-65 MG tablet Take 3 tablets by mouth 2 (two) times daily as needed for headache.     brompheniramine-pseudoephedrine-DM 30-2-10 MG/5ML syrup Take 5 mLs by mouth 4 (four) times daily as needed. 120 mL 0   fluticasone  (FLONASE ) 50 MCG/ACT nasal spray Place 2 sprays into both nostrils daily. 16 g 0   lidocaine  (XYLOCAINE ) 2 % solution Use as directed 5-10 mLs in the mouth or throat every 6 (six) hours as needed (sore throat). 100 mL 0   No facility-administered medications prior to visit.    ROS Review of Systems  Objective:  BP 120/66 (BP Location: Left Arm, Patient Position: Sitting, Cuff Size: Normal)   Pulse 77   Temp  98.5 F (36.9 C) (Oral)   Ht 5' 11 (1.803 m)   Wt 143 lb 9.6 oz (65.1 kg)   SpO2 97%   BMI 20.03 kg/m   BP Readings from Last 3 Encounters:  05/21/24 120/66  02/16/24 95/62  02/15/23 105/71    Wt Readings from Last 3 Encounters:  05/21/24 143 lb 9.6 oz (65.1 kg)  02/15/23 146 lb 0.8 oz (66.2 kg)  02/03/23 147 lb 3.2 oz (66.8 kg)    Physical Exam Cardiovascular:     Comments:  EKG--- NSR, 67 bpm No LVH, Q waves, or ST/T wave changes  Musculoskeletal:     Right lower leg: No edema.     Left lower leg: No edema.     Lab Results  Component Value Date   WBC 9.6 02/16/2024   HGB 13.6 02/16/2024   HCT 40.8 02/16/2024   PLT 268 02/16/2024   GLUCOSE 81 02/16/2024   CHOL 177 02/16/2024   TRIG 164 (H) 02/16/2024   HDL 42 02/16/2024   LDLCALC 107 (H) 02/16/2024   ALT 10 02/16/2024   AST 17 02/16/2024   NA 139 02/16/2024   K 4.3 02/16/2024   CL 104 02/16/2024   CREATININE 0.82 02/16/2024   BUN 13 02/16/2024   CO2 26 02/16/2024   TSH 5.61 (H) 05/21/2024    US  AORTA Result Date: 02/11/2023 CLINICAL DATA:  43 year old male with abnormal vascular bruit. History of syncope, tobacco use. EXAM: ULTRASOUND OF ABDOMINAL AORTA TECHNIQUE: Ultrasound examination of the abdominal aorta and proximal common iliac arteries was performed to evaluate for aneurysm. Additional color and Doppler images of the distal aorta were obtained to document patency. COMPARISON:  CT Abdomen and Pelvis 03/19/2012. FINDINGS: Abdominal aortic measurements as follows: Proximal:  2 x 2.4 cm (AP by transverse) Mid:  1.9 x 2.7 cm Distal:  1.6 x 2.0 cm Patent: Yes, peak systolic velocity is 92 cm/s Right common iliac artery: 1.1 cm Left common iliac artery: 1.0 cm IMPRESSION: No evidence of abdominal aortic aneurysm. Electronically Signed   By: VEAR Hurst M.D.   On: 02/11/2023 11:33    Assessment & Plan:  Numbness of left hand -     Vitamin B12; Future -     TSH; Future -     Folate; Future -     Methylmalonic acid, serum; Future -     Acetylcholine receptor, binding; Future -     Striated muscle antibody; Future -     DRUG MONITORING, PANEL 7 WITH CONFIRMATION, URINE; Future  Syncope, unspecified syncope type -     EKG 12-Lead -     TSH; Future -     DRUG MONITORING, PANEL 7 WITH CONFIRMATION, URINE; Future  Weakness -     Acetylcholine receptor, binding; Future -     Striated muscle  antibody; Future -     DRUG MONITORING, PANEL 7 WITH CONFIRMATION, URINE; Future     Follow-up: Return in about 3 months (around 08/19/2024).  Debby Molt, MD "

## 2024-05-21 NOTE — Telephone Encounter (Signed)
 noted

## 2024-05-22 ENCOUNTER — Ambulatory Visit: Payer: Self-pay | Admitting: Internal Medicine

## 2024-05-22 DIAGNOSIS — R55 Syncope and collapse: Secondary | ICD-10-CM | POA: Insufficient documentation

## 2024-05-22 DIAGNOSIS — E538 Deficiency of other specified B group vitamins: Secondary | ICD-10-CM | POA: Insufficient documentation

## 2024-05-22 DIAGNOSIS — R531 Weakness: Secondary | ICD-10-CM | POA: Insufficient documentation

## 2024-05-22 DIAGNOSIS — E039 Hypothyroidism, unspecified: Secondary | ICD-10-CM | POA: Insufficient documentation

## 2024-05-22 MED ORDER — CYANOCOBALAMIN 2000 MCG PO TABS: 2000.0000 ug | ORAL_TABLET | Freq: Every day | ORAL | 0 refills | Status: AC

## 2024-05-22 MED ORDER — UNITHROID 25 MCG PO TABS: 25.0000 ug | ORAL_TABLET | Freq: Every day | ORAL | 0 refills | Status: AC

## 2024-05-27 ENCOUNTER — Telehealth: Payer: Self-pay | Admitting: *Deleted

## 2024-05-27 LAB — STRIATED MUSCLE ANTIBODY: STRIATED MUSCLE AB SCREEN: NEGATIVE

## 2024-05-27 LAB — DRUG MONITORING, PANEL 7 WITH CONFIRMATION, URINE
6 Acetylmorphine: NEGATIVE ng/mL
Alcohol Metabolites: NEGATIVE ng/mL
Amphetamines: NEGATIVE ng/mL
Barbiturates: NEGATIVE ng/mL
Benzodiazepines: NEGATIVE ng/mL
Cocaine Metabolite: NEGATIVE ng/mL
Creatinine: 52.5 mg/dL
Marijuana Metabolite: 1193 ng/mL — ABNORMAL HIGH
Marijuana Metabolite: POSITIVE ng/mL — AB
Methadone Metabolite: NEGATIVE ng/mL
Opiates: NEGATIVE ng/mL
Oxidant: NEGATIVE ug/mL
Oxycodone: NEGATIVE ng/mL
pH: 7.3 (ref 4.5–9.0)

## 2024-05-27 LAB — ACETYLCHOLINE RECEPTOR, BINDING: A CHR BINDING ABS: <0.3 nmol/L

## 2024-05-27 LAB — DM TEMPLATE

## 2024-05-27 LAB — METHYLMALONIC ACID, SERUM: Methylmalonic Acid, Quant: 286 nmol/L (ref 55–335)

## 2024-05-27 NOTE — Progress Notes (Unsigned)
 Care Guide Pharmacy Note  05/27/2024 Name: BURDETT PINZON MRN: [id number] DOB: Oct 07, 1980  Referred By: Lucius Krabbe, NP Reason for referral: Call Attempt #1 and Complex Care Management (Outreach to schedule referral with pharmacist )   Norleen CHRISTELLA Bonds is a 44 y.o. year old male who is a primary care patient of Lucius Krabbe, NP.  Norleen CHRISTELLA Bonds was referred to the pharmacist for assistance related to: hypothyroidism   An unsuccessful telephone outreach was attempted today to contact the patient who was referred to the pharmacy team for assistance with medication management. Additional attempts will be made to contact the patient.  Thedford Franks, CMA, AAMA [place]  [facility], [facility] Guide, Lead Direct Dial: [phone]  Fax: [phone]

## 2024-05-28 NOTE — Progress Notes (Unsigned)
 Care Guide Pharmacy Note  05/28/2024 Name: JERIKO KOWALKE MRN: [id number] DOB: [birth year]  Referred By: Lucius Krabbe, NP Reason for referral: Call Attempt #1 and Complex Care Management (Outreach to schedule referral with pharmacist )   Norleen CHRISTELLA Bonds is a 44 y.o. year old male who is a primary care patient of Lucius Krabbe, NP.  Norleen CHRISTELLA Bonds was referred to the pharmacist for assistance related to: hypothyroidism   A second unsuccessful telephone outreach was attempted today to contact the patient who was referred to the pharmacy team for assistance with medication management. Additional attempts will be made to contact the patient.  Thedford Franks, CMA, AAMA [place]  [facility], [facility] Guide, Lead Direct Dial: [phone]  Fax: [phone]

## 2024-05-28 NOTE — Telephone Encounter (Signed)
 I think his use of THC is causing the syncope But is he needs to then yes, he should go to the ED

## 2024-05-29 NOTE — Progress Notes (Signed)
 Care Guide Pharmacy Note  05/29/2024 Name: Brian Hess MRN: [id number] DOB: 12/14/1980  Referred By: Lucius Krabbe, NP Reason for referral: Call Attempt #1 and Complex Care Management (Outreach to schedule referral with pharmacist )   Brian Hess is a 44 y.o. year old male who is a primary care patient of Lucius Krabbe, NP.  Brian Hess was referred to the pharmacist for assistance related to: hypothyroidism   A third unsuccessful telephone outreach was attempted today to contact the patient who was referred to the pharmacy team for assistance with medication management. The Population Health team is pleased to engage with this patient at any time in the future upon receipt of referral and should he/she be interested in assistance from the Population Health team.  Thedford Franks, CMA, [facility]  [facility], [facility] Guide, Lead Direct Dial: [phone]  Fax: [phone]
